# Patient Record
Sex: Female | Born: 1935 | Race: White | Hispanic: No | Smoking: Never smoker
Health system: Southern US, Community
[De-identification: ages and names within clinical notes are randomized; demographics above are authoritative.]

## PROBLEM LIST (undated history)

## (undated) DIAGNOSIS — I10 Essential (primary) hypertension: Secondary | ICD-10-CM

## (undated) DIAGNOSIS — F0390 Unspecified dementia without behavioral disturbance: Secondary | ICD-10-CM

## (undated) DIAGNOSIS — H269 Unspecified cataract: Secondary | ICD-10-CM

## (undated) DIAGNOSIS — F329 Major depressive disorder, single episode, unspecified: Secondary | ICD-10-CM

## (undated) DIAGNOSIS — F419 Anxiety disorder, unspecified: Secondary | ICD-10-CM

## (undated) HISTORY — PX: ABDOMINAL HYSTERECTOMY: SHX81

---

## 2012-01-23 ENCOUNTER — Ambulatory Visit: Payer: Self-pay | Admitting: Medical

## 2012-01-25 ENCOUNTER — Ambulatory Visit: Payer: Self-pay | Admitting: Emergency Medicine

## 2012-05-13 ENCOUNTER — Ambulatory Visit: Payer: Self-pay | Admitting: Emergency Medicine

## 2012-05-13 LAB — COMPREHENSIVE METABOLIC PANEL
Albumin: 3.6 g/dL (ref 3.4–5.0)
Alkaline Phosphatase: 61 U/L (ref 50–136)
Anion Gap: 10 (ref 7–16)
BUN: 19 mg/dL — ABNORMAL HIGH (ref 7–18)
Bilirubin,Total: 0.7 mg/dL (ref 0.2–1.0)
Calcium, Total: 9.2 mg/dL (ref 8.5–10.1)
Chloride: 101 mmol/L (ref 98–107)
Co2: 31 mmol/L (ref 21–32)
Creatinine: 0.92 mg/dL (ref 0.60–1.30)
EGFR (African American): >60
EGFR (Non-African Amer.): >60
Glucose: 110 mg/dL — ABNORMAL HIGH (ref 65–99)
Osmolality: 286 (ref 275–301)
Potassium: 3 mmol/L — ABNORMAL LOW (ref 3.5–5.1)
SGOT(AST): 27 U/L (ref 15–37)
SGPT (ALT): 26 U/L (ref 12–78)
Sodium: 142 mmol/L (ref 136–145)
Total Protein: 7.2 g/dL (ref 6.4–8.2)

## 2012-05-13 LAB — MAGNESIUM: Magnesium: 2 mg/dL

## 2012-05-13 LAB — CBC WITH DIFFERENTIAL/PLATELET
Basophil #: 0.1 10*3/uL (ref 0.0–0.1)
Basophil %: 0.8 %
Eosinophil #: 0.1 10*3/uL (ref 0.0–0.7)
Eosinophil %: 0.8 %
HCT: 44.2 % (ref 35.0–47.0)
HGB: 14.9 g/dL (ref 12.0–16.0)
Lymphocyte #: 2.2 10*3/uL (ref 1.0–3.6)
Lymphocyte %: 25.5 %
MCH: 30.8 pg (ref 26.0–34.0)
MCHC: 33.7 g/dL (ref 32.0–36.0)
MCV: 92 fL (ref 80–100)
Monocyte #: 0.6 x10 3/mm (ref 0.2–0.9)
Monocyte %: 6.8 %
Neutrophil #: 5.7 10*3/uL (ref 1.4–6.5)
Neutrophil %: 66.1 %
Platelet: 152 10*3/uL (ref 150–440)
RBC: 4.83 10*6/uL (ref 3.80–5.20)
RDW: 13.3 % (ref 11.5–14.5)
WBC: 8.7 10*3/uL (ref 3.6–11.0)

## 2012-05-13 LAB — URINALYSIS, COMPLETE
Bacteria: NEGATIVE
Bilirubin,UR: NEGATIVE
Glucose,UR: NEGATIVE mg/dL (ref 0–75)
Ketone: NEGATIVE
Leukocyte Esterase: NEGATIVE
Nitrite: NEGATIVE
Ph: 7 (ref 4.5–8.0)
Protein: NEGATIVE
Specific Gravity: 1.015 (ref 1.003–1.030)

## 2014-06-24 ENCOUNTER — Ambulatory Visit: Payer: Self-pay | Admitting: Ophthalmology

## 2014-11-24 ENCOUNTER — Emergency Department
Admission: EM | Admit: 2014-11-24 | Discharge: 2014-11-24 | Disposition: A | Discharge status: Home / Self Care | Payer: Medicare Other | Attending: Emergency Medicine | Admitting: Emergency Medicine

## 2014-11-24 ENCOUNTER — Emergency Department: Payer: Medicare Other

## 2014-11-24 ENCOUNTER — Encounter: Payer: Self-pay | Admitting: *Deleted

## 2014-11-24 ENCOUNTER — Other Ambulatory Visit: Payer: Self-pay

## 2014-11-24 DIAGNOSIS — I10 Essential (primary) hypertension: Secondary | ICD-10-CM | POA: Insufficient documentation

## 2014-11-24 DIAGNOSIS — M6281 Muscle weakness (generalized): Secondary | ICD-10-CM | POA: Diagnosis present

## 2014-11-24 DIAGNOSIS — N390 Urinary tract infection, site not specified: Secondary | ICD-10-CM

## 2014-11-24 HISTORY — DX: Essential (primary) hypertension: I10

## 2014-11-24 HISTORY — DX: Anxiety disorder, unspecified: F41.9

## 2014-11-24 HISTORY — DX: Major depressive disorder, single episode, unspecified: F32.9

## 2014-11-24 HISTORY — DX: Unspecified cataract: H26.9

## 2014-11-24 LAB — COMPREHENSIVE METABOLIC PANEL
ALT: 17 U/L (ref 14–54)
ANION GAP: 10 (ref 5–15)
AST: 33 U/L (ref 15–41)
Albumin: 3.8 g/dL (ref 3.5–5.0)
Alkaline Phosphatase: 46 U/L (ref 38–126)
BUN: 22 mg/dL — AB (ref 6–20)
CHLORIDE: 99 mmol/L — AB (ref 101–111)
CO2: 28 mmol/L (ref 22–32)
Calcium: 9.3 mg/dL (ref 8.9–10.3)
Creatinine, Ser: 0.77 mg/dL (ref 0.44–1.00)
Glucose, Bld: 110 mg/dL — ABNORMAL HIGH (ref 65–99)
POTASSIUM: 3.2 mmol/L — AB (ref 3.5–5.1)
Sodium: 137 mmol/L (ref 135–145)
TOTAL PROTEIN: 7.3 g/dL (ref 6.5–8.1)
Total Bilirubin: 0.5 mg/dL (ref 0.3–1.2)

## 2014-11-24 LAB — CBC WITH DIFFERENTIAL/PLATELET
BASOS ABS: 0.1 10*3/uL (ref 0–0.1)
Basophils Relative: 1 %
EOS PCT: 1 %
Eosinophils Absolute: 0.1 10*3/uL (ref 0–0.7)
HCT: 42.9 % (ref 35.0–47.0)
Hemoglobin: 14.6 g/dL (ref 12.0–16.0)
LYMPHS PCT: 25 %
Lymphs Abs: 2.2 10*3/uL (ref 1.0–3.6)
MCH: 31 pg (ref 26.0–34.0)
MCHC: 33.9 g/dL (ref 32.0–36.0)
MCV: 91.4 fL (ref 80.0–100.0)
MONO ABS: 0.9 10*3/uL (ref 0.2–0.9)
MONOS PCT: 11 %
Neutro Abs: 5.4 10*3/uL (ref 1.4–6.5)
Neutrophils Relative %: 62 %
PLATELETS: 139 10*3/uL — AB (ref 150–440)
RBC: 4.7 MIL/uL (ref 3.80–5.20)
RDW: 13.1 % (ref 11.5–14.5)
WBC: 8.7 10*3/uL (ref 3.6–11.0)

## 2014-11-24 LAB — URINALYSIS COMPLETE WITH MICROSCOPIC (ARMC ONLY)
BILIRUBIN URINE: NEGATIVE
GLUCOSE, UA: NEGATIVE mg/dL
KETONES UR: NEGATIVE mg/dL
NITRITE: NEGATIVE
PH: 5 (ref 5.0–8.0)
Protein, ur: NEGATIVE mg/dL
SPECIFIC GRAVITY, URINE: 1.013 (ref 1.005–1.030)

## 2014-11-24 LAB — TROPONIN I

## 2014-11-24 MED ORDER — CEPHALEXIN 500 MG PO CAPS: 500.0000 mg | ORAL_CAPSULE | Freq: Two times a day (BID) | ORAL | Status: DC

## 2014-11-24 MED ORDER — CEPHALEXIN 500 MG PO CAPS
500.0000 mg | ORAL_CAPSULE | Freq: Once | ORAL | Status: AC
  Administered 2014-11-24: 500 mg via ORAL
  Filled 2014-11-24: qty 1

## 2014-11-24 NOTE — ED Notes (Signed)
Pt arrived to ED via EMS reporting right sided weakness beginning at 1724 this evening. Pt reports not "feelign right"  At the moment. EMS reports a negative stroke screen. No deficits noted upon arrival. Pt alert and oriented. Pt denies hx stroke or TIA.

## 2014-11-24 NOTE — ED Notes (Signed)
Pt able to hold cup in right hand without signs of weakness.

## 2014-11-24 NOTE — ED Notes (Signed)
Pts nephew arrived and reported that pt stated rher right side felt weak and nephew reports momentary confusion. Pt verbalized at this time that she feels that there is a lot that went on today that she does not remember. When asked to elaborate by RN pt was not able to .

## 2014-11-24 NOTE — Discharge Instructions (Signed)
You have been seen in the Emergency Department (ED) today for generalized weakness that we believe is caused by a mild urinary tract infection (UTI).  The rest of your workup was reassuring.  Please take your antibiotic as prescribed and over-the-counter pain medication (Tylenol or Motrin) as needed, but no more than recommended on the label instructions.  Drink PLENTY of fluids.  Call your regular doctor to schedule the next available appointment to follow up on todays ED visit, or return immediately to the ED if your pain worsens, you have decreased urine production, develop fever, persistent vomiting, or other symptoms that concern you.   Urinary Tract Infection A urinary tract infection (UTI) can occur any place along the urinary tract. The tract includes the kidneys, ureters, bladder, and urethra. A type of germ called bacteria often causes a UTI. UTIs are often helped with antibiotic medicine.  HOME CARE   If given, take antibiotics as told by your doctor. Finish them even if you start to feel better.  Drink enough fluids to keep your pee (urine) clear or pale yellow.  Avoid tea, drinks with caffeine, and bubbly (carbonated) drinks.  Pee often. Avoid holding your pee in for a long time.  Pee before and after having sex (intercourse).  Wipe from front to back after you poop (bowel movement) if you are a woman. Use each tissue only once. GET HELP RIGHT AWAY IF:   You have back pain.  You have lower belly (abdominal) pain.  You have chills.  You feel sick to your stomach (nauseous).  You throw up (vomit).  Your burning or discomfort with peeing does not go away.  You have a fever.  Your symptoms are not better in 3 days. MAKE SURE YOU:   Understand these instructions.  Will watch your condition.  Will get help right away if you are not doing well or get worse. Document Released: 09/08/2007 Document Revised: 12/15/2011 Document Reviewed: 10/21/2011 [facility] Patient  Information 2015 [employer], [place]. This information is not intended to replace advice given to you by your health care provider. Make sure you discuss any questions you have with your health care provider.

## 2014-11-24 NOTE — ED Provider Notes (Signed)
[facility] Emergency Department Provider Note  ____________________________________________  Time seen: Approximately 7:29 PM  I have reviewed the triage vital signs and the nursing notes.   HISTORY  Chief Complaint Weakness  The patient is a vague historian and unable to provide details about medical history  HPI Dominique Patel is a 80 y.o. female who presents by EMS after having some generalized weakness which her family reportedly feels was more significant on the right side.  EMS performed a stroke screen which is negative and she has no focal neurological deficits.the history/onset of the symptoms is unclear, but apparently it was acute which prompted the family's concern.  The patient is comfortable at this time, smiling and joking with me and with staff.  She does not have any specific complaints other than "not feeling right" and feeling generally weak.  Of note, the paramedics told me that the patient walked to the ambulance using her cane and had no difficulties even navigating some outside stairs.  Past Medical History  Diagnosis Date  . Cataracts, bilateral   . Hypertension   . Major depressive disorder   . Anxiety disorder     There are no active problems to display for this patient.   History reviewed. No pertinent past surgical history.  Current Outpatient Rx  Name  Route  Sig  Dispense  Refill  . cephALEXin (KEFLEX) 500 MG capsule   Oral   Take 1 capsule (500 mg total) by mouth 2 (two) times daily.   14 capsule   0     Allergies Review of patient's allergies indicates not on file.  History reviewed. No pertinent family history.  Social History Social History  Substance Use Topics  . Smoking status: Never Smoker   . Smokeless tobacco: None  . Alcohol Use: No    Review of Systems Constitutional: No fever/chills Eyes: No visual changes. ENT: No sore throat. Cardiovascular: Denies chest pain. Respiratory: Denies  shortness of breath. Gastrointestinal: Mild suprapubic pain.  No nausea, no vomiting.  No diarrhea.  No constipation. Genitourinary: Negative for dysuria. Musculoskeletal: Negative for back pain. Skin: Negative for rash. Neurological: Negative for headaches, focal weakness or numbness.  10-point ROS otherwise negative.  ____________________________________________   PHYSICAL EXAM:  ED Triage Vitals  Enc Vitals Group     BP 11/24/14 1935 151/102 mmHg     Pulse Rate 11/24/14 1935 83     Resp 11/24/14 1935 17     Temp 11/24/14 1935 98.6 F (37 C)     Temp Source 11/24/14 1935 Oral     SpO2 11/24/14 1935 96 %     Weight 11/24/14 1935 191 lb 4 oz (86.75 kg)     Height 11/24/14 1935  (1.6 m)     Head Cir --      Peak Flow --      Pain Score --      Pain Loc --      Pain Edu? --      Excl. in GC? --      Constitutional: Alert and oriented. Well appearing and in no acute distress. Eyes: Conjunctivae are normal. PERRL. EOMI. Head: Atraumatic. Nose: No congestion/rhinnorhea. Mouth/Throat: Mucous membranes are moist.  Oropharynx non-erythematous. Neck: No stridor.   Cardiovascular: Normal rate, regular rhythm. Grossly normal heart sounds.  Good peripheral circulation. Respiratory: Normal respiratory effort.  No retractions. Lungs CTAB. Gastrointestinal: Soft and nontender. No distention. No abdominal bruits. No CVA tenderness. Musculoskeletal: No lower extremity tenderness  nor edema.  No joint effusions. Neurologic:  Normal speech and language. No gross focal neurologic deficits are appreciated.  Skin:  Skin is warm, dry and intact. No rash noted. Psychiatric: Mood and affect are normal. Speech and behavior are normal.  ____________________________________________   LABS (all labs ordered are listed, but only abnormal results are displayed)  Labs Reviewed  CBC WITH DIFFERENTIAL/PLATELET - Abnormal; Notable for the following:    Platelets 139 (*)    All other  components within normal limits  COMPREHENSIVE METABOLIC PANEL - Abnormal; Notable for the following:    Potassium 3.2 (*)    Chloride 99 (*)    Glucose, Bld 110 (*)    BUN 22 (*)    All other components within normal limits  URINALYSIS COMPLETEWITH MICROSCOPIC ([facility] ONLY) - Abnormal; Notable for the following:    Color, Urine YELLOW (*)    APPearance CLEAR (*)    Hgb urine dipstick 2+ (*)    Leukocytes, UA 3+ (*)    Bacteria, UA RARE (*)    Squamous Epithelial / LPF 0-5 (*)    All other components within normal limits  URINE CULTURE  TROPONIN I   ____________________________________________  EKG  ED ECG REPORT I, Raine Blodgett, the attending physician, personally viewed and interpreted this ECG.  Date: 11/24/2014 EKG Time: 19:35 Rate: 82 Rhythm: atrial fibrillation QRS Axis: normal Intervals: normal ST/T Wave abnormalities: Non-specific ST segment / T-wave changes, but no evidence of acute ischemia. Conduction Disutrbances: none Narrative Interpretation: unremarkable  ____________________________________________  RADIOLOGY I, Cailan General, personally viewed and evaluated these images (plain radiographs) as part of my medical decision making.   Ct Head Wo Contrast  11/24/2014   CLINICAL DATA:  Right side weakness beginning at 1724 hours today.  EXAM: CT HEAD WITHOUT CONTRAST  TECHNIQUE: Contiguous axial images were obtained from the base of the skull through the vertex without intravenous contrast.  COMPARISON:  None.  FINDINGS: Chronic microvascular ischemic change is identified with patchy hypoattenuation in the subcortical and periventricular deep white matter seen. No evidence of acute infarction, hemorrhage, mass lesion, mass effect, midline shift or abnormal extra-axial fluid collections identified. There is no hydrocephalus or pneumocephalus. Imaged paranasal sinuses and mastoid air cells are clear. The calvarium is intact.  IMPRESSION: No acute abnormality.  Chronic  microvascular ischemic change.   Electronically Signed   By: Drusilla Kanner M.D.   On: 11/24/2014 20:38    ____________________________________________   PROCEDURES  Procedure(s) performed: None  Critical Care performed: No   NIH Stroke Scale  Interval: Baseline Time: 9:19 PM Person Administering Scale: Raylie Maddison  Administer stroke scale items in the order listed. Record performance in each category after each subscale exam. Do not go back and change scores. Follow directions provided for each exam technique. Scores should reflect what the patient does, not what the clinician thinks the patient can do. The clinician should record answers while administering the exam and work quickly. Except where indicated, the patient should not be coached (i.e., repeated requests to patient to make a special effort).   1a  Level of consciousness: 0=alert; keenly responsive  1b. LOC questions:  0=Performs both tasks correctly  1c. LOC commands: 0=Performs both tasks correctly  2.  Best Gaze: 0=normal  3.  Visual: 0=No visual loss  4. Facial Palsy: 0=Normal symmetric movement  5a.  Motor left arm: 0=No drift, limb holds 90 (or 45) degrees for full 10 seconds  5b.  Motor right arm: 0=No drift,  limb holds 90 (or 45) degrees for full 10 seconds  6a. motor left leg: 0=No drift, limb holds 90 (or 45) degrees for full 10 seconds  6b  Motor right leg:  0=No drift, limb holds 90 (or 45) degrees for full 10 seconds  7. Limb Ataxia: 0=Absent  8.  Sensory: 0=Normal; no sensory loss  9. Best Language:  0=No aphasia, normal  10. Dysarthria: 0=Normal  11. Extinction and Inattention: 0=No abnormality  12. Distal motor function: 0=Normal   Total:   0    ____________________________________________   INITIAL IMPRESSION / ASSESSMENT AND PLAN / ED COURSE  Pertinent labs & imaging results that were available during my care of the patient were reviewed by me and considered in my medical decision making  (see chart for details).  My understanding from the paramedics is that the family was concerned about a possible stroke given their report of right sided weakness.  However, EMS states that there stroke screen was negative, and my NIH stroke scale is 0.  She has no abnormal findings on physical exam and neurological exam.  Her only complaint is that occasionally she has mild suprapubic abdominal pain.  Given the concern expressed by the family, I will obtain a CT head noncontrast for possible stroke symptoms and perform a broad evaluation with blood work and urinalysis.  I will then reassess.  ----------------------------------------- 9:17 PM on 11/24/2014 -----------------------------------------  The patient's workup was reassuring with no significant abnormalities found except for a mild to moderate urinary tract infection.  Her nephew is now present and feels that she is at her baseline and is not concerned about anythingin her current presentation.  I discussed with both of them the presence of the urinary tract infection and gave my usual and customary return precautions.  They both understand and agree and she is eager to go home. ____________________________________________  FINAL CLINICAL IMPRESSION(S) / ED DIAGNOSES  Final diagnoses:  UTI (lower urinary tract infection)      NEW MEDICATIONS STARTED DURING THIS VISIT:  New Prescriptions   CEPHALEXIN (KEFLEX) 500 MG CAPSULE    Take 1 capsule (500 mg total) by mouth 2 (two) times daily.     Loleta Rose, MD 11/24/14 2119

## 2014-11-24 NOTE — ED Notes (Signed)
Pt to CT at this time.

## 2014-11-26 LAB — URINE CULTURE: Special Requests: NORMAL

## 2015-04-18 ENCOUNTER — Other Ambulatory Visit: Payer: Self-pay | Admitting: Neurology

## 2015-04-18 DIAGNOSIS — R413 Other amnesia: Secondary | ICD-10-CM

## 2015-05-13 ENCOUNTER — Ambulatory Visit
Admission: RE | Admit: 2015-05-13 | Discharge: 2015-05-13 | Disposition: A | Discharge status: Home / Self Care | Payer: Medicare Other | Source: Ambulatory Visit | Attending: Neurology | Admitting: Neurology

## 2015-05-13 DIAGNOSIS — G3189 Other specified degenerative diseases of nervous system: Secondary | ICD-10-CM | POA: Insufficient documentation

## 2015-05-13 DIAGNOSIS — R413 Other amnesia: Secondary | ICD-10-CM

## 2015-05-13 DIAGNOSIS — R531 Weakness: Secondary | ICD-10-CM | POA: Insufficient documentation

## 2015-05-13 DIAGNOSIS — R51 Headache: Secondary | ICD-10-CM | POA: Diagnosis not present

## 2015-05-13 DIAGNOSIS — I739 Peripheral vascular disease, unspecified: Secondary | ICD-10-CM | POA: Insufficient documentation

## 2015-08-19 ENCOUNTER — Emergency Department
Admission: EM | Admit: 2015-08-19 | Discharge: 2015-08-20 | Disposition: A | Discharge status: Home / Self Care | Payer: Medicare Other | Attending: Student | Admitting: Student

## 2015-08-19 DIAGNOSIS — I10 Essential (primary) hypertension: Secondary | ICD-10-CM | POA: Diagnosis not present

## 2015-08-19 DIAGNOSIS — F329 Major depressive disorder, single episode, unspecified: Secondary | ICD-10-CM | POA: Diagnosis not present

## 2015-08-19 DIAGNOSIS — F0391 Unspecified dementia with behavioral disturbance: Secondary | ICD-10-CM | POA: Diagnosis not present

## 2015-08-19 DIAGNOSIS — R443 Hallucinations, unspecified: Secondary | ICD-10-CM | POA: Diagnosis present

## 2015-08-19 DIAGNOSIS — F0392 Unspecified dementia, unspecified severity, with psychotic disturbance: Secondary | ICD-10-CM

## 2015-08-19 DIAGNOSIS — F03918 Unspecified dementia, unspecified severity, with other behavioral disturbance: Secondary | ICD-10-CM

## 2015-08-19 DIAGNOSIS — Z79899 Other long term (current) drug therapy: Secondary | ICD-10-CM | POA: Diagnosis not present

## 2015-08-19 HISTORY — DX: Unspecified dementia, unspecified severity, without behavioral disturbance, psychotic disturbance, mood disturbance, and anxiety: F03.90

## 2015-08-19 LAB — URINALYSIS COMPLETE WITH MICROSCOPIC (ARMC ONLY)
BACTERIA UA: NONE SEEN
Bilirubin Urine: NEGATIVE
GLUCOSE, UA: NEGATIVE mg/dL
Ketones, ur: NEGATIVE mg/dL
LEUKOCYTES UA: NEGATIVE
Nitrite: NEGATIVE
PH: 6 (ref 5.0–8.0)
Protein, ur: NEGATIVE mg/dL
Specific Gravity, Urine: 1.019 (ref 1.005–1.030)

## 2015-08-19 LAB — COMPREHENSIVE METABOLIC PANEL
ALBUMIN: 3.7 g/dL (ref 3.5–5.0)
ALT: 19 U/L (ref 14–54)
AST: 29 U/L (ref 15–41)
Alkaline Phosphatase: 56 U/L (ref 38–126)
Anion gap: 8 (ref 5–15)
BUN: 26 mg/dL — AB (ref 6–20)
CHLORIDE: 98 mmol/L — AB (ref 101–111)
CO2: 30 mmol/L (ref 22–32)
Calcium: 8.9 mg/dL (ref 8.9–10.3)
Creatinine, Ser: 0.62 mg/dL (ref 0.44–1.00)
GFR calc Af Amer: >60 mL/min (ref >60)
GFR calc non Af Amer: >60 mL/min (ref >60)
GLUCOSE: 108 mg/dL — AB (ref 65–99)
POTASSIUM: 3.6 mmol/L (ref 3.5–5.1)
SODIUM: 136 mmol/L (ref 135–145)
Total Bilirubin: 0.3 mg/dL (ref 0.3–1.2)
Total Protein: 7.2 g/dL (ref 6.5–8.1)

## 2015-08-19 LAB — ACETAMINOPHEN LEVEL

## 2015-08-19 LAB — CBC
HEMATOCRIT: 42 % (ref 35.0–47.0)
HEMOGLOBIN: 14 g/dL (ref 12.0–16.0)
MCH: 30.9 pg (ref 26.0–34.0)
MCHC: 33.3 g/dL (ref 32.0–36.0)
MCV: 92.6 fL (ref 80.0–100.0)
Platelets: 145 10*3/uL — ABNORMAL LOW (ref 150–440)
RBC: 4.53 MIL/uL (ref 3.80–5.20)
RDW: 13 % (ref 11.5–14.5)
WBC: 7.1 10*3/uL (ref 3.6–11.0)

## 2015-08-19 LAB — ETHANOL

## 2015-08-19 LAB — SALICYLATE LEVEL: Salicylate Lvl: <4 mg/dL (ref 2.8–30.0)

## 2015-08-19 NOTE — ED Provider Notes (Addendum)
[facility] Emergency Department Provider Note   ____________________________________________  Time seen: Approximately 8:29 PM  I have reviewed the triage vital signs and the nursing notes.   HISTORY  Chief Complaint Hallucinations    HPI Dominique Patel is a 81 y.o. female with history of hypertension, dementia, major depressive disorder who presents for evaluation of worsening dementia and worsening chronic hallucinations, gradual onset, constant for greater than 9 months, no modifying factors. Patient reports that she hears a man singing to her. No visual hallucinations. No suicidal or homicidal ideation. Her nephew states that she currently lives independently and she is becoming difficult to care for/having a hard time living independently at this time. Family is concerned for her safety. The patient denies any chest pain, difficulty breathing, recent illness including no coughing, vomiting, diarrhea, fevers or chills.   Past Medical History  Diagnosis Date  . Cataracts, bilateral   . Hypertension   . Major depressive disorder (HCC)   . Anxiety disorder   . Dementia     There are no active problems to display for this patient.   Past Surgical History  Procedure Laterality Date  . Abdominal hysterectomy      Current Outpatient Rx  Name  Route  Sig  Dispense  Refill  . atenolol-chlorthalidone (TENORETIC) 50-25 MG tablet   Oral   Take 1 tablet by mouth daily.         Marland Kitchen donepezil (ARICEPT) 5 MG tablet   Oral   Take 1 tablet by mouth daily.         . sertraline (ZOLOFT) 50 MG tablet   Oral   Take 1 tablet by mouth daily.           Allergies Review of patient's allergies indicates no known allergies.  No family history on file.  Social History Social History  Substance Use Topics  . Smoking status: Never Smoker   . Smokeless tobacco: None  . Alcohol Use: No    Review of Systems Constitutional: No fever/chills Eyes: No  visual changes. ENT: No sore throat. Cardiovascular: Denies chest pain. Respiratory: Denies shortness of breath. Gastrointestinal: No abdominal pain.  No nausea, no vomiting.  No diarrhea.  No constipation. Genitourinary: Negative for dysuria. Musculoskeletal: Negative for back pain. Skin: Negative for rash. Neurological: Negative for headaches, focal weakness or numbness.  10-point ROS otherwise negative.  ____________________________________________   PHYSICAL EXAM:  VITAL SIGNS: ED Triage Vitals  Enc Vitals Group     BP 08/19/15 1800 137/58 mmHg     Pulse Rate 08/19/15 1800 71     Resp 08/19/15 1800 18     Temp 08/19/15 1800 98 F (36.7 C)     Temp Source 08/19/15 1800 Oral     SpO2 08/19/15 1800 98 %     Weight 08/19/15 1800 180 lb (81.647 kg)     Height 08/19/15 1800  (1.575 m)     Head Cir --      Peak Flow --      Pain Score --      Pain Loc --      Pain Edu? --      Excl. in GC? --     Constitutional: Alert and oriented x 3. Well appearing and in no acute distress. Eyes: Conjunctivae are normal. PERRL. EOMI. Head: Atraumatic. Nose: No congestion/rhinnorhea. Mouth/Throat: Mucous membranes are moist.  Oropharynx non-erythematous. Neck: No stridor.  Supple without meningismus. Cardiovascular: Normal rate, regular rhythm. Grossly normal  heart sounds.  Good peripheral circulation. Respiratory: Normal respiratory effort.  No retractions. Lungs CTAB. Gastrointestinal: Soft and nontender. No distention. No CVA tenderness. Genitourinary: deferred Musculoskeletal: No lower extremity tenderness nor edema.  No joint effusions. Neurologic:  Normal speech and language. No gross focal neurologic deficits are appreciated. Ambulates well with her cane. Skin:  Skin is warm, dry and intact. No rash noted. Psychiatric: Mood and affect are normal. Speech and behavior are normal.  ____________________________________________   LABS (all labs ordered are listed, but only  abnormal results are displayed)  Labs Reviewed  COMPREHENSIVE METABOLIC PANEL - Abnormal; Notable for the following:    Chloride 98 (*)    Glucose, Bld 108 (*)    BUN 26 (*)    All other components within normal limits  CBC - Abnormal; Notable for the following:    Platelets 145 (*)    All other components within normal limits  URINALYSIS COMPLETEWITH MICROSCOPIC ([facility] ONLY) - Abnormal; Notable for the following:    Color, Urine YELLOW (*)    APPearance CLEAR (*)    Hgb urine dipstick 1+ (*)    Squamous Epithelial / LPF 0-5 (*)    All other components within normal limits  ACETAMINOPHEN LEVEL - Abnormal; Notable for the following:    Acetaminophen (Tylenol), Serum <10 (*)    All other components within normal limits  ETHANOL  SALICYLATE LEVEL  URINE DRUG SCREEN, QUALITATIVE ([facility] ONLY)   ____________________________________________  EKG  none ____________________________________________  RADIOLOGY  none ____________________________________________   PROCEDURES  Procedure(s) performed: None  Critical Care performed: No  ____________________________________________   INITIAL IMPRESSION / ASSESSMENT AND PLAN / ED COURSE  Pertinent labs & imaging results that were available during my care of the patient were reviewed by me and considered in my medical decision making (see chart for details).  Dominique Patel is a 81 y.o. female with history of hypertension, dementia, major depressive disorder who presents for evaluation of worsening dementia and worsening chronic hallucinations. On exam, she is generally well-appearing and in no acute distress. Vital signs stable, she is afebrile. She has a benign physical exam and no acute medical complaints. I reviewed her labs. CBC unremarkable as is CMP. Urinalysis is not consistent with urinary tract infection. Undetectable ethanol, acetaminophen and salicylate levels. Her hallucinations are chronic and I suspect that they're  related to her dementia, she underwent an MRI of her brain just 2 months ago which showed only chronic disease, Nothing acute. Will consult behavioral health, TTS as well as social work for geropsychiatric evaluation. Care transferred to Dr. Zenda AlpersWebster at 11:15 PM. ____________________________________________   FINAL CLINICAL IMPRESSION(S) / ED DIAGNOSES  Final diagnoses:  Dementia with behavioral disturbance      NEW MEDICATIONS STARTED DURING THIS VISIT:  New Prescriptions   No medications on file     Note:  This document was prepared using Dragon voice recognition software and may include unintentional dictation errors.    Gayla DossEryka A Mandie Crabbe, MD 08/19/15 [id number]  Gayla DossEryka A Braxley Balandran, MD 08/19/15 [phone]

## 2015-08-19 NOTE — ED Notes (Signed)
Left and right hearing aids put in denture case and pt. Label put on and given to caretakers to take home.  Will return if needed.

## 2015-08-19 NOTE — ED Notes (Signed)
THIS EDT ASSISTED PT WITH CHANGING OUT INTO SCRUBS. THE NURSE (MATT, RN) TOOK PT'S HEARING AIDS OUT AND SENT THEM HOME WITH HER SON.

## 2015-08-19 NOTE — ED Notes (Signed)
Pt is here with her nephew, pt states she is having visual and auditory hallucinations and wants help for it.. States it is a man and she is fearful.. Pt is calm and cooperative at present.. Family states this has been going on for a few months..Dominique Patel

## 2015-08-20 DIAGNOSIS — F329 Major depressive disorder, single episode, unspecified: Secondary | ICD-10-CM

## 2015-08-20 DIAGNOSIS — F0391 Unspecified dementia with behavioral disturbance: Secondary | ICD-10-CM

## 2015-08-20 DIAGNOSIS — F0392 Unspecified dementia, unspecified severity, with psychotic disturbance: Secondary | ICD-10-CM

## 2015-08-20 LAB — URINE DRUG SCREEN, QUALITATIVE (ARMC ONLY)
Amphetamines, Ur Screen: NOT DETECTED
BARBITURATES, UR SCREEN: NOT DETECTED
BENZODIAZEPINE, UR SCRN: NOT DETECTED
Cannabinoid 50 Ng, Ur ~~LOC~~: NOT DETECTED
Cocaine Metabolite,Ur ~~LOC~~: NOT DETECTED
MDMA (Ecstasy)Ur Screen: NOT DETECTED
Methadone Scn, Ur: NOT DETECTED
OPIATE, UR SCREEN: NOT DETECTED
PHENCYCLIDINE (PCP) UR S: NOT DETECTED
Tricyclic, Ur Screen: NOT DETECTED

## 2015-08-20 MED ORDER — QUETIAPINE FUMARATE 25 MG PO TABS: 25.0000 mg | ORAL_TABLET | Freq: Every day | ORAL | Status: DC

## 2015-08-20 NOTE — Consult Note (Signed)
[place] Psychiatry Consult   Reason for Consult:  Consult for this 81 year old woman who presented with concerns about new onset hallucinations Referring Physician:  Joni Fears Patient Identification: Dominique Patel MRN:  [id number] Principal Diagnosis: Dementia with psychosis Diagnosis:   Patient Active Problem List   Diagnosis Date Noted  . Major depressive disorder ([place]) [F32.9] 08/20/2015  . Dementia with psychosis [F03.91] 08/20/2015    Total Time spent with patient: 1 hour  Subjective:   Dominique Patel is a 81 y.o. female patient admitted with "I have head problems".  HPI:  Patient interviewed. Case discussed with ER doctor and TTS. I also interviewed the patient's nephew and his wife who are the closest relatives and look in on the patient regularly. Chart reviewed labs reviewed. Spoke with outpatient psychiatrist Dr. poor. Patient presents in the company of her family with concerns that she has started to have hallucinations. Evidently over the last week or so she has started to talk about having a man who lives in a house sittings church songs to her all day long. She admits that she cannot see him and that no one else can see him either. Apparently she was complaining about this to her family but to me she indicates that it's not that big of a problem. Patient admits that her mood stays unhappy affairs of the time. She sleeps okay. She says she thinks she could probably be eating better. She denies any suicidal thoughts or wish to die denies homicidal ideation. She says that she is compliant with her medicine. Nephew says that he thinks that she is really not compliant with her medicine and that she has been forgetting to take it more frequently.  Social history: [place] Her nephew is her closest relative. Checks by on her about once or twice a week. Patient has a friend of about her age who also helps her out at times with her medicine.  Doesn't appear to have home health.  Medical history: History of high blood pressure. No other really clear medical problems.  Substance abuse history: Denies any alcohol or drug use and denies any past alcohol or drug use.  Past Psychiatric History: Patient has been seeing Dr. Nicolasa Ducking for outpatient treatment of depression. She developed a depression last year which got markedly better when she started on Zoloft. Dr. Nicolasa Ducking saw her relatively recently and the current memory problems seem to be a decline even from then. No history of psychiatric hospitalization no history of suicide attempts  Risk to Self: Suicidal Ideation: No Suicidal Intent: No Is patient at risk for suicide?: No Suicidal Plan?: No Access to Means: No What has been your use of drugs/alcohol within the last 12 months?: Reports of none How many times?: 0 Other Self Harm Risks: Reports of none Triggers for Past Attempts: None known Intentional Self Injurious Behavior: None Risk to Others: Homicidal Ideation: No Thoughts of Harm to Others: No Current Homicidal Intent: No Current Homicidal Plan: No Access to Homicidal Means: No Identified Victim: Reports of none History of harm to others?: No Assessment of Violence: None Noted Violent Behavior Description: Reports of none Does patient have access to weapons?: No Criminal Charges Pending?: No Does patient have a court date: No Prior Inpatient Therapy: Prior Inpatient Therapy: No Prior Therapy Dates: Reports of none Prior Therapy Facilty/Provider(s): Reports of none Reason for Treatment: Reports of none Prior Outpatient Therapy: Prior Outpatient Therapy: Yes Prior Therapy Dates: Current Prior  Therapy Facilty/Provider(s): Dr. Gates Rigg [facility]) Reason for Treatment: Depression Does patient have an ACCT team?: No Does patient have Intensive In-House Services?  : No Does patient have [facility] services? : No Does patient have P4CC services?: No  Past Medical  History:  Past Medical History  Diagnosis Date  . Cataracts, bilateral   . Hypertension   . Major depressive disorder ([place])   . Anxiety disorder   . Dementia     Past Surgical History  Procedure Laterality Date  . Abdominal hysterectomy     Family History: No family history on file. Family Psychiatric  History: She says she has other relatives including her brother who have Alzheimer's disease Social History:  History  Alcohol Use No     History  Drug Use Not on file    Social History   Social History  . Marital Status: Married    Spouse Name: N/A  . Number of Children: N/A  . Years of Education: N/A   Social History Main Topics  . Smoking status: Never Smoker   . Smokeless tobacco: None  . Alcohol Use: No  . Drug Use: None  . Sexual Activity: Not Asked   Other Topics Concern  . None   Social History Narrative   Additional Social History:    Allergies:  No Known Allergies  Labs:  Results for orders placed or performed during the hospital encounter of 08/19/15 (from the past 48 hour(s))  Comprehensive metabolic panel     Status: Abnormal   Collection Time: 08/19/15  6:08 PM  Result Value Ref Range   Sodium 136 135 - 145 mmol/L   Potassium 3.6 3.5 - 5.1 mmol/L   Chloride 98 (L) 101 - 111 mmol/L   CO2 30 22 - 32 mmol/L   Glucose, Bld 108 (H) 65 - 99 mg/dL   BUN 26 (H) 6 - 20 mg/dL   Creatinine, Ser 0.62 0.44 - 1.00 mg/dL   Calcium 8.9 8.9 - 10.3 mg/dL   Total Protein 7.2 6.5 - 8.1 g/dL   Albumin 3.7 3.5 - 5.0 g/dL   AST 29 15 - 41 U/L   ALT 19 14 - 54 U/L   Alkaline Phosphatase 56 38 - 126 U/L   Total Bilirubin 0.3 0.3 - 1.2 mg/dL   GFR calc non Af Amer >60 >60 mL/min   GFR calc Af Amer >60 >60 mL/min    Comment: (NOTE) The eGFR has been calculated using the CKD EPI equation. This calculation has not been validated in all clinical situations. eGFR's persistently <60 mL/min signify possible Chronic Kidney Disease.    Anion gap 8 5 - 15  CBC      Status: Abnormal   Collection Time: 08/19/15  6:08 PM  Result Value Ref Range   WBC 7.1 3.6 - 11.0 K/uL   RBC 4.53 3.80 - 5.20 MIL/uL   Hemoglobin 14.0 12.0 - 16.0 g/dL   HCT 42.0 35.0 - 47.0 %   MCV 92.6 80.0 - 100.0 fL   MCH 30.9 26.0 - 34.0 pg   MCHC 33.3 32.0 - 36.0 g/dL   RDW 13.0 11.5 - 14.5 %   Platelets 145 (L) 150 - 440 K/uL  Urinalysis complete, with microscopic ([facility] only)     Status: Abnormal   Collection Time: 08/19/15  6:08 PM  Result Value Ref Range   Color, Urine YELLOW (A) YELLOW   APPearance CLEAR (A) CLEAR   Glucose, UA NEGATIVE NEGATIVE mg/dL   Bilirubin Urine NEGATIVE  NEGATIVE   Ketones, ur NEGATIVE NEGATIVE mg/dL   Specific Gravity, Urine 1.019 1.005 - 1.030   Hgb urine dipstick 1+ (A) NEGATIVE   pH 6.0 5.0 - 8.0   Protein, ur NEGATIVE NEGATIVE mg/dL   Nitrite NEGATIVE NEGATIVE   Leukocytes, UA NEGATIVE NEGATIVE   RBC / HPF 0-5 0 - 5 RBC/hpf   WBC, UA 0-5 0 - 5 WBC/hpf   Bacteria, UA NONE SEEN NONE SEEN   Squamous Epithelial / LPF 0-5 (A) NONE SEEN   Mucous PRESENT   Ethanol     Status: None   Collection Time: 08/19/15  6:08 PM  Result Value Ref Range   Alcohol, Ethyl (B) <5 <5 mg/dL    Comment:        LOWEST DETECTABLE LIMIT FOR SERUM ALCOHOL IS 5 mg/dL FOR MEDICAL PURPOSES ONLY   Acetaminophen level     Status: Abnormal   Collection Time: 08/19/15  6:08 PM  Result Value Ref Range   Acetaminophen (Tylenol), Serum <10 (L) 10 - 30 ug/mL    Comment:        THERAPEUTIC CONCENTRATIONS VARY SIGNIFICANTLY. A RANGE OF 10-30 ug/mL MAY BE AN EFFECTIVE CONCENTRATION FOR MANY PATIENTS. HOWEVER, SOME ARE BEST TREATED AT CONCENTRATIONS OUTSIDE THIS RANGE. ACETAMINOPHEN CONCENTRATIONS >150 ug/mL AT 4 HOURS AFTER INGESTION AND >50 ug/mL AT 12 HOURS AFTER INGESTION ARE OFTEN ASSOCIATED WITH TOXIC REACTIONS.   Salicylate level     Status: None   Collection Time: 08/19/15  6:08 PM  Result Value Ref Range   Salicylate Lvl <9.7 2.8 - 30.0 mg/dL  Urine  Drug Screen, Qualitative ([facility] only)     Status: None   Collection Time: 08/19/15  6:08 PM  Result Value Ref Range   Tricyclic, Ur Screen NONE DETECTED NONE DETECTED   Amphetamines, Ur Screen NONE DETECTED NONE DETECTED   MDMA (Ecstasy)Ur Screen NONE DETECTED NONE DETECTED   Cocaine Metabolite,Ur Clint NONE DETECTED NONE DETECTED   Opiate, Ur Screen NONE DETECTED NONE DETECTED   Phencyclidine (PCP) Ur S NONE DETECTED NONE DETECTED   Cannabinoid 50 Ng, Ur [place] NONE DETECTED NONE DETECTED   Barbiturates, Ur Screen NONE DETECTED NONE DETECTED   Benzodiazepine, Ur Scrn NONE DETECTED NONE DETECTED   Methadone Scn, Ur NONE DETECTED NONE DETECTED    Comment: (NOTE) 673  Tricyclics, urine               Cutoff 1000 ng/mL 200  Amphetamines, urine             Cutoff 1000 ng/mL 300  MDMA (Ecstasy), urine           Cutoff 500 ng/mL 400  Cocaine Metabolite, urine       Cutoff 300 ng/mL 500  Opiate, urine                   Cutoff 300 ng/mL 600  Phencyclidine (PCP), urine      Cutoff 25 ng/mL 700  Cannabinoid, urine              Cutoff 50 ng/mL 800  Barbiturates, urine             Cutoff 200 ng/mL 900  Benzodiazepine, urine           Cutoff 200 ng/mL 1000 Methadone, urine                Cutoff 300 ng/mL 1100 1200 The urine drug screen provides only a preliminary, unconfirmed 1300 analytical test result and  should not be used for non-medical 1400 purposes. Clinical consideration and professional judgment should 1500 be applied to any positive drug screen result due to possible 1600 interfering substances. A more specific alternate chemical method 1700 must be used in order to obtain a confirmed analytical result.  1800 Gas chromato graphy / mass spectrometry (GC/MS) is the preferred 1900 confirmatory method.     No current facility-administered medications for this encounter.   Current Outpatient Prescriptions  Medication Sig Dispense Refill  . atenolol-chlorthalidone (TENORETIC) 50-25 MG tablet  Take 1 tablet by mouth daily.    Marland Kitchen donepezil (ARICEPT) 5 MG tablet Take 1 tablet by mouth daily.    . sertraline (ZOLOFT) 50 MG tablet Take 1 tablet by mouth daily.    . QUEtiapine (SEROQUEL) 25 MG tablet Take 1 tablet (25 mg total) by mouth at bedtime. 30 tablet 0    Musculoskeletal: Strength & Muscle Tone: decreased Gait & Station: normal Patient leans: N/A  Psychiatric Specialty Exam: Review of Systems  Constitutional: Negative.   HENT: Negative.   Eyes: Negative.   Respiratory: Negative.   Cardiovascular: Negative.   Gastrointestinal: Negative.   Musculoskeletal: Negative.   Skin: Negative.   Neurological: Negative.   Psychiatric/Behavioral: Positive for depression, hallucinations and memory loss. Negative for suicidal ideas and substance abuse. The patient is nervous/anxious. The patient does not have insomnia.     Blood pressure 130/72, pulse 72, temperature 98.2 F (36.8 C), temperature source Oral, resp. rate 20, height '5\' 2"'  (1.575 m), weight 81.647 kg (180 lb), SpO2 97 %.Body mass index is 32.91 kg/(m^2).  General Appearance: Casual  Eye Contact::  Minimal  Speech:  Slow  Volume:  Decreased  Mood:  Dysphoric  Affect:  Constricted  Thought Process:  Goal Directed  Orientation:  Full (Time, Place, and Person)  Thought Content:  Hallucinations: Auditory  Suicidal Thoughts:  No  Homicidal Thoughts:  No  Memory:  Immediate;   Good Recent;   Poor Remote;   Fair  Judgement:  Fair  Insight:  Fair  Psychomotor Activity:  Decreased  Concentration:  Fair  Recall:  [employer] of Knowledge:Fair  Language: Fair  Akathisia:  No  Handed:  Right  AIMS (if indicated):     Assets:  Communication Skills Desire for Improvement Housing Resilience Social Support  ADL's:  Impaired  Cognition: Impaired,  Mild  Sleep:      Treatment Plan Summary: Medication management and Plan 81 year old woman with recent development of more memory problems and confusion and auditory  hallucinations. Differential diagnosis would include a worsening of her depression which could be related to noncompliance with her antidepressant or worsening dementia with psychotic symptoms. I discussed the situation with the patient and with her nephew and with Dr. Burt Knack poor. I advised the family that we could admit her to a psychiatric hospital but that that would require referral to a geriatric facility which may take several days and could result in hospitalization some distance away. We discussed an alternative that she would go home they would try and have more close supervision of her for the next couple days and I would start her on 25 mg of Seroquel at night. She or he has an appointment to see Dr. Nicolasa Ducking poor on Friday. Family is agreeable to this. Prescription is written. Case reviewed with emergency room doctor. Patient will follow-up with outpatient psychiatrist on Friday.  Disposition: Patient does not meet criteria for psychiatric inpatient admission. Supportive therapy provided about ongoing stressors.  Alethia Berthold, MD 08/20/2015 4:04 PM

## 2015-08-20 NOTE — BH Assessment (Signed)
Assessment Note  Dominique Patel is an 81 y.o. female who presents to the ER due to increase AV/H and symptoms of her depression. Patient states for the last 9 months she's had an increase of crying spells, being agitated & irritable with herself. She is also starting to isolate herself, "because I don't won't nobody to see me like this." "I get that way about me. It feels like I'm not getting nothing right." Example she shared, "I get up to go to the bathroom and end up going to the kitchen and getting a glass of water."  Within the same 9 months, she has started having AV/H. "I see a man and he is there all the time. He sings to me. Mostly [employer] thou art [employer]n)." She further states, the man is there throughout the day, mostly at night "when the sun starts going down." She also reports of seeing other people in and on the outside of the home. This take place throughout the day as well.  Per her psychiatrists, Dr. Maryruth Bun, the patient has history of early dementia. When she initially started having AV/H, she had a UTI. She was started on an antibiotic by her PCP. Per nursing staff, patient does not have a UTI with his ER visit.  Patient have supports in her family. Her nephew Wayna Chalet [phone] cell), who lives near her and he goes to her house and helps. She also listed two other friends (Nellie Acorn  & Ferdie Ping) who go by her home and help her out. Patient is able to walk and uses a cane. She is able to do her ADL's but family and friends help her due to her age and she lives alone.  Per the report of the nephew ([name]), she's started to have AV/H. She's getting to the point of forgetting to take her medications. She lives alone and they "are doing their best to help." He and the patient's neighbor are helping her. "They (neighbor) calls and remind her to take her medicine. The neighbors granddaughter goes by there and helps to. Back in September, we brought  her up their because she had a stroke and that's when we found out she had UTI." It was also when they found out she was in the early stages of Dementia.  Her PCP is Dr. Hale Bogus & Dr. Charletta Cousin, with [facility].  Patient denies SI/HI. She have no history of violence or aggression. During the interview, she was calm cooperative and polite. She has some insight about the AV/H. She's aware they are not real and they are not command in nature.   Past Medical History:  Past Medical History  Diagnosis Date  . Cataracts, bilateral   . Hypertension   . Major depressive disorder ([facility])   . Anxiety disorder   . Dementia     Past Surgical History  Procedure Laterality Date  . Abdominal hysterectomy      Family History: No family history on file.  Social History:  reports that she has never smoked. She does not have any smokeless tobacco history on file. She reports that she does not drink alcohol. Her drug history is not on file.  Additional Social History:  Alcohol / Drug Use Pain Medications: See PTA Prescriptions: See PTA Over the Counter: See PTA History of alcohol / drug use?: No history of alcohol / drug abuse Longest period of sobriety (when/how long): Reports of no use Negative Consequences of Use:  (Reports of none) Withdrawal  Symptoms:  (Reports of none)  CIWA: CIWA-Ar BP: (!) 125/57 mmHg Pulse Rate: (!) 58 COWS:    Allergies: No Known Allergies  Home Medications:  (Not in a hospital admission)  OB/GYN Status:  No LMP recorded. Patient has had a hysterectomy.  General Assessment Data [facility] Assessment: In system Is this a Tele or Face-to-Face Assessment?: Face-to-Face Is this an Initial Assessment or a Re-assessment for this encounter?: Initial Assessment Marital status: Widowed DillonMaiden name: Mayford KnifeWilliams Is patient pregnant?: No Pregnancy Status: No Living Arrangements: Alone (Family and friends go to the house and help her) Can pt return  to current living arrangement?: Yes Admission Status: Voluntary Is patient capable of signing voluntary admission?: Yes Referral Source: Self/Family/Friend Insurance type: Medicare  Medical Screening Exam [facility] Walk-[facility] ONLY) Medical Exam completed: Yes  Crisis Care Plan Living Arrangements: Alone (Family and friends go to the house and help her) Legal Guardian: Other: (None) Name of Psychiatrist: Dr. Bethanie DickerKupar (Private office) Name of Therapist: Reports of none  Education Status Is patient currently in school?: No Current Grade: n/a Highest grade of school patient has completed: 9th Grade Name of school: n/a Contact person: n/a  Risk to self with the past 6 months Suicidal Ideation: No Has patient been a risk to self within the past 6 months prior to admission? : No Suicidal Intent: No Has patient had any suicidal intent within the past 6 months prior to admission? : No Is patient at risk for suicide?: No Suicidal Plan?: No Has patient had any suicidal plan within the past 6 months prior to admission? : No Access to Means: No What has been your use of drugs/alcohol within the last 12 months?: Reports of none Previous Attempts/Gestures: No How many times?: 0 Other Self Harm Risks: Reports of none Triggers for Past Attempts: None known Intentional Self Injurious Behavior: None Family Suicide History: No Recent stressful life event(s): Other (Comment) (Recent onset of  AV/H) Persecutory voices/beliefs?: No Depression: Yes Depression Symptoms: Feeling angry/irritable, Feeling worthless/self pity, Loss of interest in usual pleasures, Tearfulness, Isolating Substance abuse history and/or treatment for substance abuse?: No Suicide prevention information given to non-admitted patients: Not applicable  Risk to Others within the past 6 months Homicidal Ideation: No Does patient have any lifetime risk of violence toward others beyond the six months prior to admission? : No Thoughts of  Harm to Others: No Current Homicidal Intent: No Current Homicidal Plan: No Access to Homicidal Means: No Identified Victim: Reports of none History of harm to others?: No Assessment of Violence: None Noted Violent Behavior Description: Reports of none Does patient have access to weapons?: No Criminal Charges Pending?: No Does patient have a court date: No Is patient on probation?: No  Psychosis Hallucinations: Auditory, Visual Delusions: None noted  Mental Status Report Appearance/Hygiene: In scrubs, Unremarkable, In hospital gown Eye Contact: Good Motor Activity: Unable to assess (Patient laying in bed) Speech: Logical/coherent, Unremarkable Level of Consciousness: Alert Mood: Anxious, Depressed, Pleasant, Helpless, Sad Affect: Anxious, Appropriate to circumstance, Sad, Depressed Anxiety Level: Minimal Thought Processes: Coherent, Relevant Judgement: Partial Orientation: Person, Place, Time, Situation, Appropriate for developmental age Obsessive Compulsive Thoughts/Behaviors: Minimal  Cognitive Functioning Concentration: Normal Memory: Recent Intact, Remote Intact IQ: Average Insight: Fair Impulse Control: Fair Appetite: Good ("Oh, it's excellent!!! Especially Lays Chips") Weight Loss: 10 Weight Gain: 0 Sleep: No Change Total Hours of Sleep: 11 Vegetative Symptoms: None  ADLScreening [facility] Assessment Services) Patient's cognitive ability adequate to safely complete daily activities?:  Yes Patient able to express need for assistance with ADLs?: Yes Independently performs ADLs?: Yes (appropriate for developmental age)  Prior Inpatient Therapy Prior Inpatient Therapy: No Prior Therapy Dates: Reports of none Prior Therapy Facilty/Provider(s): Reports of none Reason for Treatment: Reports of none  Prior Outpatient Therapy Prior Outpatient Therapy: Yes Prior Therapy Dates: Current Prior Therapy Facilty/Provider(s): Dr. Bethanie Dicker (Private Office) Reason for Treatment:  Depression Does patient have an ACCT team?: No Does patient have Intensive In-House Services?  : No Does patient have [facility] services? : No Does patient have P4CC services?: No  ADL Screening (condition at time of admission) Patient's cognitive ability adequate to safely complete daily activities?: Yes Is the patient deaf or have difficulty hearing?: Yes (Use hearing aid in both ears) Does the patient have difficulty seeing, even when wearing glasses/contacts?: No Does the patient have difficulty concentrating, remembering, or making decisions?: No Patient able to express need for assistance with ADLs?: Yes Does the patient have difficulty dressing or bathing?: No Independently performs ADLs?: Yes (appropriate for developmental age) Does the patient have difficulty walking or climbing stairs?: Yes (Use cane) Weakness of Legs: Both Weakness of Arms/Hands: Both  Home Assistive Devices/Equipment Home Assistive Devices/Equipment: Cane (specify quad or straight), Dentures (specify type), Other (Comment) (Use portable shower chair)  Therapy Consults (therapy consults require a physician order) PT Evaluation Needed: No OT Evalulation Needed: No SLP Evaluation Needed: No Abuse/Neglect Assessment (Assessment to be complete while patient is alone) Physical Abuse: Denies Verbal Abuse: Denies Sexual Abuse: Denies Exploitation of patient/patient's resources: Denies Self-Neglect: Denies Values / Beliefs Cultural Requests During Hospitalization: None Spiritual Requests During Hospitalization: None Consults Spiritual Care Consult Needed: No Social Work Consult Needed: No Merchant navy officer (For Healthcare) Does patient have an advance directive?: No Would patient like information on creating an advanced directive?: No - patient declined information    Additional Information 1:1 In Past 12 Months?: No CIRT Risk: No Elopement Risk: No Does patient have medical clearance?:  Yes  Child/Adolescent Assessment Running Away Risk: Denies (Patient is an adult)  Disposition:  Disposition Initial Assessment Completed for this Encounter: Yes Disposition of Patient: Other dispositions (ER MD ordered Psych Consult) Other disposition(s): Other (Comment) (ER MD ordered Psych Consult)  On Site Evaluation by:   Reviewed with Physician:    Lilyan Gilford MS, LCAS, LPC, NCC, CCSI Therapeutic Triage Specialist 08/20/2015 11:09 AM

## 2015-08-20 NOTE — ED Provider Notes (Signed)
-----------------------------------------   7:15 AM on 08/20/2015 -----------------------------------------   Blood pressure 125/57, pulse 58, temperature 98.2 F (36.8 C), temperature source Oral, resp. rate 16, height 5\' 2"  (1.575 m), weight 180 lb (81.647 kg), SpO2 98 %.  The patient had no acute events since last update.  Calm and cooperative at this time.    The patient is awaiting psych evaluation     Rebecka ApleyAllison P Webster, MD 08/20/15 [phone]

## 2015-08-20 NOTE — ED Notes (Signed)
NAD noted at this time. Pt resting in hospital bed at this time. Denies any needs at this time. Will continue to monitor q15 min safety checks to evaluate for further patient needs.

## 2015-08-20 NOTE — ED Notes (Signed)
Pt noted to be resting in bed with her eyes closed, respirations even and unlabored, awakens with mild stimuli at this time. Will continue to monitor with q15 min safety checks at this time.

## 2015-08-20 NOTE — ED Notes (Signed)
NAD noted at time of D/C. Explained to patient's nephew and his wife and reiterated to patient's nephew about starting seroquel prescription tonight and following up with Dr. Maryruth BunKapur on Friday. Pt's family states understanding of patient D/C instructions at this time. NAD noted, respirations even and unlabored, skin warm, dry, and intact. Pt taken to the lobby via patient relations with her nephew and his wife.

## 2015-08-20 NOTE — ED Notes (Signed)
Nephew with pt. In room.  Nephew reports pt. Has been having visual and auditory hallucinations for months.  Nephew states the AH/VH have become more frequent.  Pt. Recently started on Zoloft.

## 2015-08-20 NOTE — ED Provider Notes (Signed)
-----------------------------------------   5:01 PM on 08/20/2015 -----------------------------------------  Dr. Toni Amendlapacs evaluated the patient, recommends discharge home. He has provided a prescription for Seroquel. The patient will follow-up with Dr. Maryruth BunKapur on Friday. The patient and her family are comfortable with this plan. DC home.  Gayla DossEryka A Lynnie Koehler, MD 08/20/15 [phone]

## 2015-08-20 NOTE — Progress Notes (Signed)
Clinical Child psychotherapistocial Worker (CSW) received consult for gero-psych. Psych consult is pending. CSW passed patient's outpatient psychiatrist in the hall way and she stated that this is new on set hallucinations and she would recommend inpatient psych. CSW made TTS Calvin aware of above. CSW will continue to follow and assist as needed.   Jetta LoutBailey Morgan, LCSW [phone]

## 2015-08-20 NOTE — ED Notes (Signed)
MD at bedside. 

## 2015-08-20 NOTE — ED Notes (Signed)
Pt given meal tray at this time 

## 2015-08-20 NOTE — ED Notes (Signed)
Pt resting in bed at this time. Eyes noted to be open. Pt denies any needs at this time. Will continue to monitor with q15 min safety checks at this time.

## 2015-08-20 NOTE — ED Notes (Signed)
Calvin, TTS at bedside at this time.  

## 2015-08-20 NOTE — ED Notes (Signed)
Pt visualized in NAD. Resting in bed with her eyes closed at this time. Will continue to monitor q15 min for patient needs.

## 2015-11-27 ENCOUNTER — Encounter: Payer: Self-pay | Admitting: Internal Medicine

## 2015-11-27 ENCOUNTER — Emergency Department: Payer: Medicare Other

## 2015-11-27 ENCOUNTER — Inpatient Hospital Stay
Admission: EM | Admit: 2015-11-27 | Discharge: 2015-11-29 | DRG: 202 | Payer: Medicare Other | Attending: Internal Medicine | Admitting: Internal Medicine

## 2015-11-27 DIAGNOSIS — Z90710 Acquired absence of both cervix and uterus: Secondary | ICD-10-CM | POA: Diagnosis not present

## 2015-11-27 DIAGNOSIS — N3000 Acute cystitis without hematuria: Secondary | ICD-10-CM | POA: Diagnosis present

## 2015-11-27 DIAGNOSIS — E876 Hypokalemia: Secondary | ICD-10-CM | POA: Diagnosis present

## 2015-11-27 DIAGNOSIS — I482 Chronic atrial fibrillation, unspecified: Secondary | ICD-10-CM | POA: Diagnosis present

## 2015-11-27 DIAGNOSIS — I10 Essential (primary) hypertension: Secondary | ICD-10-CM | POA: Diagnosis present

## 2015-11-27 DIAGNOSIS — Z7982 Long term (current) use of aspirin: Secondary | ICD-10-CM

## 2015-11-27 DIAGNOSIS — R0682 Tachypnea, not elsewhere classified: Secondary | ICD-10-CM | POA: Diagnosis not present

## 2015-11-27 DIAGNOSIS — Z79899 Other long term (current) drug therapy: Secondary | ICD-10-CM | POA: Diagnosis not present

## 2015-11-27 DIAGNOSIS — Z820 Family history of epilepsy and other diseases of the nervous system: Secondary | ICD-10-CM | POA: Diagnosis not present

## 2015-11-27 DIAGNOSIS — F0390 Unspecified dementia without behavioral disturbance: Secondary | ICD-10-CM | POA: Diagnosis present

## 2015-11-27 DIAGNOSIS — J45909 Unspecified asthma, uncomplicated: Secondary | ICD-10-CM | POA: Diagnosis present

## 2015-11-27 DIAGNOSIS — J209 Acute bronchitis, unspecified: Principal | ICD-10-CM | POA: Diagnosis present

## 2015-11-27 DIAGNOSIS — F329 Major depressive disorder, single episode, unspecified: Secondary | ICD-10-CM | POA: Diagnosis present

## 2015-11-27 DIAGNOSIS — I4891 Unspecified atrial fibrillation: Secondary | ICD-10-CM | POA: Diagnosis present

## 2015-11-27 DIAGNOSIS — H269 Unspecified cataract: Secondary | ICD-10-CM | POA: Diagnosis present

## 2015-11-27 DIAGNOSIS — F0391 Unspecified dementia with behavioral disturbance: Secondary | ICD-10-CM | POA: Diagnosis present

## 2015-11-27 DIAGNOSIS — F0392 Unspecified dementia, unspecified severity, with psychotic disturbance: Secondary | ICD-10-CM | POA: Diagnosis present

## 2015-11-27 DIAGNOSIS — D72829 Elevated white blood cell count, unspecified: Secondary | ICD-10-CM

## 2015-11-27 DIAGNOSIS — J189 Pneumonia, unspecified organism: Secondary | ICD-10-CM | POA: Diagnosis present

## 2015-11-27 LAB — URINALYSIS COMPLETE WITH MICROSCOPIC (ARMC ONLY)
BILIRUBIN URINE: NEGATIVE
GLUCOSE, UA: NEGATIVE mg/dL
KETONES UR: NEGATIVE mg/dL
NITRITE: POSITIVE — AB
Protein, ur: NEGATIVE mg/dL
SPECIFIC GRAVITY, URINE: 1.026 (ref 1.005–1.030)
pH: 5 (ref 5.0–8.0)

## 2015-11-27 LAB — CBC WITH DIFFERENTIAL/PLATELET
BASOS ABS: 0.1 10*3/uL (ref 0–0.1)
BASOS PCT: 1 %
EOS ABS: 0.3 10*3/uL (ref 0–0.7)
EOS PCT: 3 %
HCT: 44.3 % (ref 35.0–47.0)
HEMOGLOBIN: 15.2 g/dL (ref 12.0–16.0)
Lymphocytes Relative: 24 %
Lymphs Abs: 2.8 10*3/uL (ref 1.0–3.6)
MCH: 31.5 pg (ref 26.0–34.0)
MCHC: 34.4 g/dL (ref 32.0–36.0)
MCV: 91.7 fL (ref 80.0–100.0)
Monocytes Absolute: 1.5 10*3/uL — ABNORMAL HIGH (ref 0.2–0.9)
Monocytes Relative: 12 %
NEUTROS PCT: 60 %
Neutro Abs: 7 10*3/uL — ABNORMAL HIGH (ref 1.4–6.5)
PLATELETS: 170 10*3/uL (ref 150–440)
RBC: 4.82 MIL/uL (ref 3.80–5.20)
RDW: 13.1 % (ref 11.5–14.5)
WBC: 11.7 10*3/uL — AB (ref 3.6–11.0)

## 2015-11-27 LAB — COMPREHENSIVE METABOLIC PANEL
ALBUMIN: 3.4 g/dL — AB (ref 3.5–5.0)
ALK PHOS: 68 U/L (ref 38–126)
ALT: 20 U/L (ref 14–54)
ANION GAP: 8 (ref 5–15)
AST: 35 U/L (ref 15–41)
BUN: 25 mg/dL — ABNORMAL HIGH (ref 6–20)
CHLORIDE: 98 mmol/L — AB (ref 101–111)
CO2: 32 mmol/L (ref 22–32)
Calcium: 9.3 mg/dL (ref 8.9–10.3)
Creatinine, Ser: 0.77 mg/dL (ref 0.44–1.00)
GFR calc Af Amer: >60 mL/min (ref >60)
GFR calc non Af Amer: >60 mL/min (ref >60)
GLUCOSE: 156 mg/dL — AB (ref 65–99)
Potassium: 3.2 mmol/L — ABNORMAL LOW (ref 3.5–5.1)
SODIUM: 138 mmol/L (ref 135–145)
TOTAL PROTEIN: 7.2 g/dL (ref 6.5–8.1)
Total Bilirubin: 1.2 mg/dL (ref 0.3–1.2)

## 2015-11-27 LAB — PROTIME-INR
INR: 1.11
Prothrombin Time: 14.3 seconds (ref 11.4–15.2)

## 2015-11-27 LAB — TROPONIN I

## 2015-11-27 LAB — APTT: APTT: 28 s (ref 24–36)

## 2015-11-27 LAB — LACTIC ACID, PLASMA: LACTIC ACID, VENOUS: 2 mmol/L — AB (ref 0.5–1.9)

## 2015-11-27 MED ORDER — SODIUM CHLORIDE 0.9% FLUSH
3.0000 mL | Freq: Two times a day (BID) | INTRAVENOUS | Status: DC
  Administered 2015-11-27 – 2015-11-29 (×4): 3 mL via INTRAVENOUS

## 2015-11-27 MED ORDER — ALBUTEROL SULFATE (2.5 MG/3ML) 0.083% IN NEBU
2.5000 mg | INHALATION_SOLUTION | Freq: Once | RESPIRATORY_TRACT | Status: AC
  Administered 2015-11-27: 2.5 mg via RESPIRATORY_TRACT
  Filled 2015-11-27: qty 3

## 2015-11-27 MED ORDER — ALBUTEROL SULFATE (2.5 MG/3ML) 0.083% IN NEBU: 2.5000 mg | INHALATION_SOLUTION | RESPIRATORY_TRACT | Status: DC | PRN

## 2015-11-27 MED ORDER — ONDANSETRON HCL 4 MG PO TABS: 4.0000 mg | ORAL_TABLET | Freq: Four times a day (QID) | ORAL | Status: DC | PRN

## 2015-11-27 MED ORDER — VANCOMYCIN HCL IN DEXTROSE 1-5 GM/200ML-% IV SOLN
1000.0000 mg | INTRAVENOUS | Status: DC
  Administered 2015-11-28: 1000 mg via INTRAVENOUS
  Filled 2015-11-27: qty 200

## 2015-11-27 MED ORDER — ACETAMINOPHEN 325 MG PO TABS: 650.0000 mg | ORAL_TABLET | Freq: Four times a day (QID) | ORAL | Status: DC | PRN

## 2015-11-27 MED ORDER — VANCOMYCIN HCL IN DEXTROSE 1-5 GM/200ML-% IV SOLN
1000.0000 mg | INTRAVENOUS | Status: DC
  Filled 2015-11-27: qty 200

## 2015-11-27 MED ORDER — SERTRALINE HCL 50 MG PO TABS
50.0000 mg | ORAL_TABLET | Freq: Every day | ORAL | Status: DC
  Administered 2015-11-28 – 2015-11-29 (×2): 50 mg via ORAL
  Filled 2015-11-27 (×2): qty 1

## 2015-11-27 MED ORDER — DEXTROSE 5 % IV SOLN
2.0000 g | Freq: Two times a day (BID) | INTRAVENOUS | Status: DC
  Administered 2015-11-28 – 2015-11-29 (×3): 2 g via INTRAVENOUS
  Filled 2015-11-27 (×4): qty 2

## 2015-11-27 MED ORDER — ACETAMINOPHEN 650 MG RE SUPP: 650.0000 mg | Freq: Four times a day (QID) | RECTAL | Status: DC | PRN

## 2015-11-27 MED ORDER — HEPARIN (PORCINE) IN NACL 100-0.45 UNIT/ML-% IJ SOLN
1100.0000 [IU]/h | INTRAMUSCULAR | Status: DC
  Administered 2015-11-28: 1100 [IU]/h via INTRAVENOUS
  Filled 2015-11-27: qty 250

## 2015-11-27 MED ORDER — HEPARIN BOLUS VIA INFUSION
4000.0000 [IU] | Freq: Once | INTRAVENOUS | Status: AC
  Administered 2015-11-28: 4000 [IU] via INTRAVENOUS
  Filled 2015-11-27: qty 4000

## 2015-11-27 MED ORDER — QUETIAPINE FUMARATE 25 MG PO TABS
50.0000 mg | ORAL_TABLET | Freq: Two times a day (BID) | ORAL | Status: DC
  Administered 2015-11-28 – 2015-11-29 (×3): 50 mg via ORAL
  Filled 2015-11-27 (×3): qty 2

## 2015-11-27 MED ORDER — DONEPEZIL HCL 5 MG PO TABS
5.0000 mg | ORAL_TABLET | Freq: Every day | ORAL | Status: DC
  Administered 2015-11-28 – 2015-11-29 (×2): 5 mg via ORAL
  Filled 2015-11-27 (×2): qty 1

## 2015-11-27 MED ORDER — VANCOMYCIN HCL IN DEXTROSE 1-5 GM/200ML-% IV SOLN
1000.0000 mg | Freq: Once | INTRAVENOUS | Status: AC
  Administered 2015-11-27: 1000 mg via INTRAVENOUS
  Filled 2015-11-27: qty 200

## 2015-11-27 MED ORDER — CEFEPIME HCL 1 G IJ SOLR
1.0000 g | Freq: Once | INTRAMUSCULAR | Status: AC
  Administered 2015-11-27: 1 g via INTRAVENOUS
  Filled 2015-11-27: qty 1

## 2015-11-27 MED ORDER — ATENOLOL 25 MG PO TABS
50.0000 mg | ORAL_TABLET | Freq: Two times a day (BID) | ORAL | Status: DC
  Administered 2015-11-27 – 2015-11-28 (×2): 50 mg via ORAL
  Filled 2015-11-27 (×2): qty 2

## 2015-11-27 MED ORDER — ASPIRIN EC 81 MG PO TBEC
81.0000 mg | DELAYED_RELEASE_TABLET | Freq: Every day | ORAL | Status: DC
  Administered 2015-11-28 – 2015-11-29 (×2): 81 mg via ORAL
  Filled 2015-11-27 (×2): qty 1

## 2015-11-27 MED ORDER — ONDANSETRON HCL 4 MG/2ML IJ SOLN: 4.0000 mg | Freq: Four times a day (QID) | INTRAMUSCULAR | Status: DC | PRN

## 2015-11-27 MED ORDER — SODIUM CHLORIDE 0.9 % IV SOLN
INTRAVENOUS | Status: AC
  Administered 2015-11-28: via INTRAVENOUS

## 2015-11-27 NOTE — Progress Notes (Signed)
Pt lactic acid 2.0. MD Willis notified. MD to place orders. Will continue to monitor.   Mayra NeerNesbitt, Linzy Laury M

## 2015-11-27 NOTE — ED Provider Notes (Signed)
[facility] Emergency Department Provider Note    First MD Initiated Contact with Patient 11/27/15 1904     (approximate)  I have reviewed the triage vital signs and the nursing notes.   HISTORY  Chief Complaint Shortness of Breath; Cough; and Dementia    HPI Dominique Patel is a 81 y.o. female presenting from [facility] due to reported concern for pneumonia. Patient has a history of severe dementia.  Patient does have productive cough. Patient currently encephalopathic and unable to report why she is brought to the ER. According to EMS that the patient's roommate was recently brought to the ER today and admitted for pneumonia. She shakes her head when asked if she has any abdominal pain, nausea or vomiting.   Past Medical History:  Diagnosis Date  . Anxiety disorder   . Cataracts, bilateral   . Dementia   . Hypertension   . Major depressive disorder [facility])     Patient Active Problem List   Diagnosis Date Noted  . Major depressive disorder (HCC) 08/20/2015  . Dementia with psychosis 08/20/2015    Past Surgical History:  Procedure Laterality Date  . ABDOMINAL HYSTERECTOMY      Prior to Admission medications   Medication Sig Start Date End Date Taking? Authorizing Provider  atenolol-chlorthalidone (TENORETIC) 50-25 MG tablet Take 1 tablet by mouth daily. 06/12/15   Historical Provider, MD  donepezil (ARICEPT) 5 MG tablet Take 1 tablet by mouth daily. 07/06/15   Historical Provider, MD  QUEtiapine (SEROQUEL) 25 MG tablet Take 1 tablet (25 mg total) by mouth at bedtime. 08/20/15   Audery Amel, MD  sertraline (ZOLOFT) 50 MG tablet Take 1 tablet by mouth daily. 07/18/15   Historical Provider, MD    Allergies Review of patient's allergies indicates no known allergies.  FMH: unable to obtain 2/2 dementia  Social History Social History  Substance Use Topics  . Smoking status: Never Smoker  . Smokeless tobacco: Not on  file  . Alcohol use No    Review of Systems Unable to obtain 2/2 dementia ____________________________________________   PHYSICAL EXAM:  VITAL SIGNS: Vitals:   11/27/15 1927 11/27/15 2006  BP: 125/74 (!) 127/94  Pulse: 76 77  Resp: (!) 22 (!) 22  Temp: 98.1 F (36.7 C) 99.9 F (37.7 C)    Constitutional: Alert  disoriented (baseline) Eyes: Conjunctivae are normal. PERRL. EOMI. Head: Atraumatic. Nose: No congestion/rhinnorhea. Mouth/Throat: Mucous membranes are moist.  Oropharynx non-erythematous. Neck: No stridor. Painless ROM. No cervical spine tenderness to palpation Hematological/Lymphatic/Immunilogical: No cervical lymphadenopathy. Cardiovascular: Normal rate, regular rhythm. Grossly normal heart sounds.  Good peripheral circulation. Respiratory: Normal respiratory effort.  No retractions. Pronounced right sided inspiratory and expiratory wheezing with crackles in the base Gastrointestinal: Soft and nontender. No distention. No abdominal bruits. No CVA tenderness. Musculoskeletal: No lower extremity tenderness nor edema.  No joint effusions. Neurologic:  Normal speech and language. No gross focal neurologic deficits are appreciated. No gait instability. Skin:  Skin is warm, dry and intact. No rash noted.  ____________________________________________   LABS (all labs ordered are listed, but only abnormal results are displayed)  Results for orders placed or performed during the hospital encounter of 11/27/15 (from the past 24 hour(s))  Comprehensive metabolic panel     Status: Abnormal   Collection Time: 11/27/15  7:27 PM  Result Value Ref Range   Sodium 138 135 - 145 mmol/L   Potassium 3.2 (L) 3.5 - 5.1 mmol/L  Chloride 98 (L) 101 - 111 mmol/L   CO2 32 22 - 32 mmol/L   Glucose, Bld 156 (H) 65 - 99 mg/dL   BUN 25 (H) 6 - 20 mg/dL   Creatinine, Ser 1.61 0.44 - 1.00 mg/dL   Calcium 9.3 8.9 - 09.6 mg/dL   Total Protein 7.2 6.5 - 8.1 g/dL   Albumin 3.4 (L) 3.5 -  5.0 g/dL   AST 35 15 - 41 U/L   ALT 20 14 - 54 U/L   Alkaline Phosphatase 68 38 - 126 U/L   Total Bilirubin 1.2 0.3 - 1.2 mg/dL   GFR calc non Af Amer >60 >60 mL/min   GFR calc Af Amer >60 >60 mL/min   Anion gap 8 5 - 15  CBC WITH DIFFERENTIAL     Status: Abnormal   Collection Time: 11/27/15  7:27 PM  Result Value Ref Range   WBC 11.7 (H) 3.6 - 11.0 K/uL   RBC 4.82 3.80 - 5.20 MIL/uL   Hemoglobin 15.2 12.0 - 16.0 g/dL   HCT 04.5 40.9 - 81.1 %   MCV 91.7 80.0 - 100.0 fL   MCH 31.5 26.0 - 34.0 pg   MCHC 34.4 32.0 - 36.0 g/dL   RDW 91.4 78.2 - 95.6 %   Platelets 170 150 - 440 K/uL   Neutrophils Relative % 60 %   Neutro Abs 7.0 (H) 1.4 - 6.5 K/uL   Lymphocytes Relative 24 %   Lymphs Abs 2.8 1.0 - 3.6 K/uL   Monocytes Relative 12 %   Monocytes Absolute 1.5 (H) 0.2 - 0.9 K/uL   Eosinophils Relative 3 %   Eosinophils Absolute 0.3 0 - 0.7 K/uL   Basophils Relative 1 %   Basophils Absolute 0.1 0 - 0.1 K/uL  Troponin I     Status: None   Collection Time: 11/27/15  7:27 PM  Result Value Ref Range   Troponin I <0.03 <0.03 ng/mL   ____________________________________________  EKG My review and personal interpretation at Time: 20:07   Indication: sob  Rate: 75  Rhythm: afib Axis: normal Other: no acute ST elevations ____________________________________________  RADIOLOGY  CXR my read shows patchy infiltrate in RLL, no PTX ____________________________________________   PROCEDURES  Procedure(s) performed: none    Critical Care performed: no ____________________________________________   INITIAL IMPRESSION / ASSESSMENT AND PLAN / ED COURSE  Pertinent labs & imaging results that were available during my care of the patient were reviewed by me and considered in my medical decision making (see chart for details).  DDX: PNA, CHF< ACS< sepsis, uti  Dominique Patel is a 81 y.o. who presents to the ED with acute kidney with mild respiratory distress in the setting of a  patient with severe dementia presenting from assisted living facility. Presentation is concerning for pneumonia given isolated lung findings on the right side with leukocytosis and fever with tachypnea. Her abdominal exam is soft and benign. She has no meningismus. Her mental status appears to be at baseline. Do not feel is clinically consistent with meningitis or encephalitis. We will order laboratory evaluation to assess her above complaints. We'll order chest x-ray to evaluate for congestive heart failure or consolidation. We'll provide nebulizer for wheezing and antibiotics for suspected pneumonia.  Clinical Course  Comment By Time  Patient does have acute leukocytosis with left shift. Elevated the bun suggesting dehydration.  She has mild improvement after albuterol treatment. Antibiotics currently infusing.  Based on her moderate respiratory distress and evidence of acute  pneumonia have discussed case with Dr. Anne HahnWillis who agrees to admit patient for further evaluation and management. Willy EddyPatrick Jakie Debow, MD 08/24 2028     ____________________________________________   FINAL CLINICAL IMPRESSION(S) / ED DIAGNOSES  Final diagnoses:  HCAP (healthcare-associated pneumonia)  Tachypnea  Leukocytosis  Dementia, without behavioral disturbance      NEW MEDICATIONS STARTED DURING THIS VISIT:  New Prescriptions   No medications on file     Note:  This document was prepared using Dragon voice recognition software and may include unintentional dictation errors.    Willy EddyPatrick Tisha Cline, MD 11/27/15 2031

## 2015-11-27 NOTE — Progress Notes (Addendum)
Pharmacy Antibiotic Note  Dominique Patel is a 81 y.o. female admitted on 11/27/2015 with sepsis.  Pharmacy has been consulted for vancomycin and cefepime dosing.  Plan: DW 65kg  Vd 46L kei 0.053 hr-1  t1/2 13 hours Vancomycin 1 gram q 18 hours ordered with stacked dosing. Level before 5th dose. Goal trough 15-20.  Cefepime 2 grams q 12 hours ordered.   Height: 5\' 4"  (162.6 cm) Weight: 180 lb (81.6 kg) IBW/kg (Calculated) : 54.7  Temp (24hrs), Avg:98.9 F (37.2 C), Min:97.7 F (36.5 C), Max:99.9 F (37.7 C)   Recent Labs Lab 11/27/15 1927  WBC 11.7*  CREATININE 0.77    Estimated Creatinine Clearance: 59 mL/min (by C-G formula based on SCr of 0.8 mg/dL).    No Known Allergies  Antimicrobials this admission: vancomycin  >>  cefepime  >>   Dose adjustments this admission:   Microbiology results: 8/24 BCx: pending 8/24 Sputum Cx: pending   8/24 UA: LE(+) NO2(+) WBC TNTC 8/24 CXR: no acute disease  Thank you for allowing pharmacy to be a part of this patient's care.  Kimora Stankovic S 11/27/2015 10:19 PM

## 2015-11-27 NOTE — H&P (Signed)
[facility] [place] [facility]   PATIENT NAME: Dominique HeadsSally Patel    MR#:  [id number]  DATE OF BIRTH:  11/01/1934  DATE OF ADMISSION:  11/27/2015  PRIMARY CARE PHYSICIAN: Pcp Not In System   REQUESTING/REFERRING PHYSICIAN: Roxan Hockeyobinson, MD  CHIEF COMPLAINT:   Chief Complaint  Patient presents with  . Shortness of Breath  . Cough  . Dementia    HISTORY OF PRESENT ILLNESS:  Dominique Patel  is a 81 y.o. female who presents with Shortness of breath and progressive cough productive of sputum. Patient has no history of COPD or other lung diseases. She states she feels like she had low-grade fever today. Other symptoms have been progressing over the last couple of days. Here in the ED she was evaluated and felt to have pneumonia. She has been residing in a nursing facility.  She states she was scheduled to be discharged to home today, but then was sent to the ED for evaluation. Chest x-ray is not read as suggestive of pneumonia but clinically patient has significant symptoms and so hospitalists were called for admission.  PAST MEDICAL HISTORY:   Past Medical History:  Diagnosis Date  . Anxiety disorder   . Cataracts, bilateral   . Dementia   . Hypertension   . Major depressive disorder (HCC)     PAST SURGICAL HISTORY:   Past Surgical History:  Procedure Laterality Date  . ABDOMINAL HYSTERECTOMY      SOCIAL HISTORY:   Social History  Substance Use Topics  . Smoking status: Never Smoker  . Smokeless tobacco: Not on file  . Alcohol use No    FAMILY HISTORY:   Family History  Problem Relation Age of Onset  . Alzheimer's disease Brother     DRUG ALLERGIES:  No Known Allergies  MEDICATIONS AT HOME:   Prior to Admission medications   Medication Sig Start Date End Date Taking? Authorizing Provider  acetaminophen (TYLENOL) 325 MG tablet Take 650 mg by mouth every 6 (six) hours as needed.   Yes Historical Provider, MD  aspirin EC 81 MG tablet Take 81  mg by mouth daily.   Yes Historical Provider, MD  donepezil (ARICEPT) 5 MG tablet Take 1 tablet by mouth daily. 07/06/15  Yes Historical Provider, MD  QUEtiapine (SEROQUEL) 50 MG tablet Take 50 mg by mouth 2 (two) times daily.   Yes Historical Provider, MD  sertraline (ZOLOFT) 50 MG tablet Take 1 tablet by mouth daily. 07/18/15  Yes Historical Provider, MD  vitamin B-12 (CYANOCOBALAMIN) 1000 MCG tablet Take 1,000 mcg by mouth daily.   Yes Historical Provider, MD  atenolol-chlorthalidone (TENORETIC) 50-25 MG tablet Take 1 tablet by mouth daily. 06/12/15   Historical Provider, MD    REVIEW OF SYSTEMS:  Review of Systems  Constitutional: Positive for fever (subjective). Negative for chills, malaise/fatigue and weight loss.  HENT: Negative for ear pain, hearing loss and tinnitus.   Eyes: Negative for blurred vision, double vision, pain and redness.  Respiratory: Positive for cough, sputum production and shortness of breath. Negative for hemoptysis.   Cardiovascular: Negative for chest pain, palpitations, orthopnea and leg swelling.  Gastrointestinal: Negative for abdominal pain, constipation, diarrhea, nausea and vomiting.  Genitourinary: Negative for dysuria, frequency and hematuria.  Musculoskeletal: Negative for back pain, joint pain and neck pain.  Skin:       No acne, rash, or lesions  Neurological: Negative for dizziness, tremors, focal weakness and weakness.  Endo/Heme/Allergies: Negative for polydipsia. Does not bruise/bleed easily.  Psychiatric/Behavioral: Negative for depression. The patient is not nervous/anxious and does not have insomnia.      VITAL SIGNS:   Vitals:   11/27/15 1927 11/27/15 2006 11/27/15 2030 11/27/15 2100  BP: 125/74 (!) 127/94 130/74 127/79  Pulse: 76 77 81 84  Resp: (!) 22 (!) 22 19 18   Temp: 98.1 F (36.7 C) 99.9 F (37.7 C)  99.4 F (37.4 C)  TempSrc: Oral Oral  Oral  SpO2: 97% 95% 93% 94%  Weight: 81.6 kg (180 lb)     Height: 5\' 4"  (1.626 m)      Wt  Readings from Last 3 Encounters:  11/27/15 81.6 kg (180 lb)  08/19/15 81.6 kg (180 lb)  11/24/14 86.8 kg (191 lb 4 oz)    PHYSICAL EXAMINATION:  Physical Exam  Vitals reviewed. Constitutional: She is oriented to person, place, and time. She appears well-developed and well-nourished. No distress.  HENT:  Head: Normocephalic and atraumatic.  Mouth/Throat: Oropharynx is clear and moist.  Eyes: Conjunctivae and EOM are normal. Pupils are equal, round, and reactive to light. No scleral icterus.  Neck: Normal range of motion. Neck supple. No JVD present. No thyromegaly present.  Cardiovascular: Normal rate, regular rhythm and intact distal pulses.  Exam reveals no gallop and no friction rub.   No murmur heard. Respiratory: Effort normal. No respiratory distress. She has no wheezes. She has no rales.  Bilateral right greater than left basilar coarse breath sounds  GI: Soft. Bowel sounds are normal. She exhibits no distension. There is no tenderness.  Musculoskeletal: Normal range of motion. She exhibits no edema.  No arthritis, no gout  Lymphadenopathy:    She has no cervical adenopathy.  Neurological: She is alert and oriented to person, place, and time. No cranial nerve deficit.  No dysarthria, no aphasia  Skin: Skin is warm and dry. No rash noted. No erythema.  Psychiatric: She has a normal mood and affect. Her behavior is normal. Judgment and thought content normal.    LABORATORY PANEL:   CBC  Recent Labs Lab 11/27/15 1927  WBC 11.7*  HGB 15.2  HCT 44.3  PLT 170   ------------------------------------------------------------------------------------------------------------------  Chemistries   Recent Labs Lab 11/27/15 1927  NA 138  K 3.2*  CL 98*  CO2 32  GLUCOSE 156*  BUN 25*  CREATININE 0.77  CALCIUM 9.3  AST 35  ALT 20  ALKPHOS 68  BILITOT 1.2    ------------------------------------------------------------------------------------------------------------------  Cardiac Enzymes  Recent Labs Lab 11/27/15 1927  TROPONINI <0.03   ------------------------------------------------------------------------------------------------------------------  RADIOLOGY:  Dg Chest 2 View  Result Date: 11/27/2015 CLINICAL DATA:  Pt arrives via [facility] from [facility] with reports of shortness of breath, wheezing and a cough Pt with a history of dementia EXAM: CHEST  2 VIEW COMPARISON:  None. FINDINGS: The cardiac silhouette is normal in size and configuration. No mediastinal or hilar masses or evidence of adenopathy. Lungs are hyperexpanded but clear. No pleural effusion or pneumothorax. Skeletal structures are demineralized but grossly intact. IMPRESSION: No acute cardiopulmonary disease. Electronically Signed   By: Amie Portland M.D.   On: 11/27/2015 19:55    EKG:   Orders placed or performed during the hospital encounter of 11/27/15  . ED EKG 12-Lead  . ED EKG 12-Lead  . EKG 12-Lead  . EKG 12-Lead    IMPRESSION AND PLAN:  Principal Problem:   HCAP (healthcare-associated pneumonia) - broad spectrum antibiotics started in the ED and cultures ordered, including sputum culture. When necessary DuoNeb's Active  Problems:   New onset atrial fibrillation (HCC) - patient was found to be in A. fib, on chart review no prior history of the same. Heparin drip started, and she will likely need evaluation for further anticoagulation upon discharge. Might consider cardiology consult   Major depressive disorder (HCC) - continue home meds   Dementia with psychosis - continue home meds   HTN (hypertension) - currently stable, continue home meds  All the records are reviewed and case discussed with ED provider. Management plans discussed with the patient and/or family.  DVT PROPHYLAXIS: Systemic anticoagulation  GI PROPHYLAXIS: PPI  ADMISSION STATUS:  Inpatient  CODE STATUS: Full Code Status History    This patient does not have a recorded code status. Please follow your organizational policy for patients in this situation.      TOTAL TIME TAKING CARE OF THIS PATIENT: 45 minutes.    Shaul Trautman FIELDING 11/27/2015, 9:05 PM  [facility]  Office  [phone]  CC: Primary care physician; Pcp Not In System

## 2015-11-27 NOTE — ED Notes (Signed)
Pt seems to be more confused at this time. Pt knows she at is at the hospital. Pt not attempting to get out of the bed. Talking about [employer], someone she cares for at the AL facility. Pt states she doesn't want to got back to [facility].

## 2015-11-27 NOTE — ED Notes (Signed)
Admiting MD at bedside

## 2015-11-27 NOTE — ED Triage Notes (Signed)
[facility] with reports of shortness of breath, wheezing and a cough  Pt with a history of dementia

## 2015-11-27 NOTE — Progress Notes (Signed)
ANTICOAGULATION CONSULT NOTE - Initial Consult  Pharmacy Consult for heparin drip Indication: atrial fibrillation  No Known Allergies  Patient Measurements: Height: 5\' 4"  (162.6 cm) Weight: 180 lb (81.6 kg) IBW/kg (Calculated) : 54.7 Heparin Dosing Weight: 72kg  Vital Signs: Temp: 97.7 F (36.5 C) (08/24 2202) Temp Source: Oral (08/24 2202) BP: 140/76 (08/24 2202) Pulse Rate: 85 (08/24 2202)  Labs:  Recent Labs  11/27/15 1927 11/27/15 1937  HGB 15.2  --   HCT 44.3  --   PLT 170  --   APTT  --  28  LABPROT  --  14.3  INR  --  1.11  CREATININE 0.77  --   TROPONINI <0.03  --     Estimated Creatinine Clearance: 59 mL/min (by C-G formula based on SCr of 0.8 mg/dL).   Medical History: Past Medical History:  Diagnosis Date  . Anxiety disorder   . Cataracts, bilateral   . Dementia   . Hypertension   . Major depressive disorder (HCC)     Medications:  No anticoagulation in PTA meds.  Assessment:  Goal of Therapy:  Heparin level 0.3-0.7 units/ml Monitor platelets by anticoagulation protocol: Yes   Plan:  4000 unit bolus and initial rate of 1100 units/hr. First anti-Xa 8 hours after start of infusion.  Marcelle Bebout S 11/27/2015,11:16 PM

## 2015-11-28 LAB — CBC
HCT: 41.3 % (ref 35.0–47.0)
Hemoglobin: 14.1 g/dL (ref 12.0–16.0)
MCH: 31.2 pg (ref 26.0–34.0)
MCHC: 34.1 g/dL (ref 32.0–36.0)
MCV: 91.5 fL (ref 80.0–100.0)
PLATELETS: 141 10*3/uL — AB (ref 150–440)
RBC: 4.52 MIL/uL (ref 3.80–5.20)
RDW: 13.1 % (ref 11.5–14.5)
WBC: 11.6 10*3/uL — ABNORMAL HIGH (ref 3.6–11.0)

## 2015-11-28 LAB — HEPARIN LEVEL (UNFRACTIONATED): Heparin Unfractionated: 0.87 IU/mL — ABNORMAL HIGH (ref 0.30–0.70)

## 2015-11-28 LAB — BASIC METABOLIC PANEL
Anion gap: 8 (ref 5–15)
BUN: 22 mg/dL — ABNORMAL HIGH (ref 6–20)
CALCIUM: 8.5 mg/dL — AB (ref 8.9–10.3)
CHLORIDE: 99 mmol/L — AB (ref 101–111)
CO2: 30 mmol/L (ref 22–32)
CREATININE: 0.55 mg/dL (ref 0.44–1.00)
GFR calc Af Amer: >60 mL/min (ref >60)
GFR calc non Af Amer: >60 mL/min (ref >60)
GLUCOSE: 119 mg/dL — AB (ref 65–99)
Potassium: 2.9 mmol/L — ABNORMAL LOW (ref 3.5–5.1)
Sodium: 137 mmol/L (ref 135–145)

## 2015-11-28 LAB — LACTIC ACID, PLASMA
LACTIC ACID, VENOUS: 1.1 mmol/L (ref 0.5–1.9)
Lactic Acid, Venous: 1.6 mmol/L (ref 0.5–1.9)

## 2015-11-28 LAB — MAGNESIUM: Magnesium: 1.6 mg/dL — ABNORMAL LOW (ref 1.7–2.4)

## 2015-11-28 LAB — POTASSIUM: Potassium: 3.1 mmol/L — ABNORMAL LOW (ref 3.5–5.1)

## 2015-11-28 MED ORDER — MAGNESIUM SULFATE 2 GM/50ML IV SOLN
2.0000 g | Freq: Once | INTRAVENOUS | Status: AC
  Administered 2015-11-28: 2 g via INTRAVENOUS
  Filled 2015-11-28: qty 50

## 2015-11-28 MED ORDER — POTASSIUM CHLORIDE 10 MEQ/100ML IV SOLN
10.0000 meq | INTRAVENOUS | Status: DC
  Administered 2015-11-28: 10 meq via INTRAVENOUS
  Filled 2015-11-28 (×4): qty 100

## 2015-11-28 MED ORDER — APIXABAN 5 MG PO TABS
5.0000 mg | ORAL_TABLET | Freq: Two times a day (BID) | ORAL | Status: DC
  Administered 2015-11-28 – 2015-11-29 (×3): 5 mg via ORAL
  Filled 2015-11-28 (×3): qty 1

## 2015-11-28 MED ORDER — POTASSIUM CHLORIDE CRYS ER 20 MEQ PO TBCR
40.0000 meq | EXTENDED_RELEASE_TABLET | ORAL | Status: AC
  Administered 2015-11-28 (×2): 40 meq via ORAL
  Filled 2015-11-28 (×2): qty 2

## 2015-11-28 MED ORDER — IPRATROPIUM-ALBUTEROL 0.5-2.5 (3) MG/3ML IN SOLN: 3.0000 mL | RESPIRATORY_TRACT | Status: DC

## 2015-11-28 MED ORDER — IPRATROPIUM-ALBUTEROL 0.5-2.5 (3) MG/3ML IN SOLN
3.0000 mL | RESPIRATORY_TRACT | Status: DC | PRN
Start: 2015-11-28 — End: 2015-11-29

## 2015-11-28 MED ORDER — ATENOLOL 25 MG PO TABS
50.0000 mg | ORAL_TABLET | Freq: Every day | ORAL | Status: DC
  Administered 2015-11-29: 50 mg via ORAL
  Filled 2015-11-28: qty 2

## 2015-11-28 NOTE — Progress Notes (Signed)
ANTICOAGULATION CONSULT NOTE - Initial Consult  Pharmacy Consult for Apixaban Indication: atrial fibrillation  No Known Allergies  Patient Measurements: Height: 5\' 4"  (162.6 cm) Weight: 180 lb (81.6 kg) IBW/kg (Calculated) : 54.7 Heparin Dosing Weight:  Vital Signs: Temp: 98.5 F (36.9 C) (08/25 0450) Temp Source: Oral (08/25 0450) BP: 126/57 (08/25 0450) Pulse Rate: 73 (08/25 0450)  Labs:  Recent Labs  11/27/15 1927 11/27/15 1937 11/28/15 0449 11/28/15 0751  HGB 15.2  --  14.1  --   HCT 44.3  --  41.3  --   PLT 170  --  141*  --   APTT  --  28  --   --   LABPROT  --  14.3  --   --   INR  --  1.11  --   --   HEPARINUNFRC  --   --   --  0.87*  CREATININE 0.77  --  0.55  --   TROPONINI <0.03  --   --   --     Estimated Creatinine Clearance: 59 mL/min (by C-G formula based on SCr of 0.8 mg/dL).   Medical History: Past Medical History:  Diagnosis Date  . Anxiety disorder   . Cataracts, bilateral   . Dementia   . Hypertension   . Major depressive disorder (HCC)     Medications:  Scheduled:  . apixaban  5 mg Oral BID  . aspirin EC  81 mg Oral Daily  . atenolol  50 mg Oral BID  . ceFEPime (MAXIPIME) IV  2 g Intravenous Q12H  . donepezil  5 mg Oral Daily  . potassium chloride  40 mEq Oral Q4H  . QUEtiapine  50 mg Oral BID  . sertraline  50 mg Oral Daily  . sodium chloride flush  3 mL Intravenous Q12H    Assessment: 81 yo female with new onset Atrial fibrillation on aspirin at home.  Goal of Therapy:   Monitor platelets by anticoagulation protocol: Yes   Plan:  Begin Apixaban 5 mg po bid at time that Heparin drip is discontinued.  64(81 yo F , Wt 81.6 kg Scr 0.55).   Lucillie Kiesel A 11/28/2015,9:27 AM

## 2015-11-28 NOTE — [place] FL2 (Addendum)
[place] MEDICAID FL2 LEVEL OF CARE SCREENING TOOL     IDENTIFICATION  Patient Name: Dominique Patel Birthdate: 1934/09/30 Sex: female Admission Date (Current Location): 11/27/2015  [place]   Facility and Address:  [facility], [address], [place], [place] [address]      Provider Number: [id number]  Attending Physician Name and Address:  Enid Baasadhika Kalisetti, MD  Relative Name and Phone Number:  Ricky StabsWhite,Margaret H Friend [phone] or Damita LackWilliams,[name] Nephew [phone]     Current Level of Care: Hospital Recommended Level of Care: Assisted Living Facility, Memory Care Prior Approval Number:    Date Approved/Denied:   PASRR Number:    Discharge Plan: Other (Comment) (ALF with Memory Care)    Current Diagnoses: Patient Active Problem List   Diagnosis Date Noted  . HCAP (healthcare-associated pneumonia) 11/27/2015  . HTN (hypertension) 11/27/2015  . New onset atrial fibrillation (HCC) 11/27/2015  . Major depressive disorder (HCC) 08/20/2015  . Dementia with psychosis 08/20/2015    Orientation RESPIRATION BLADDER Height & Weight     Self, Place  Normal Continent Weight: 180 lb (81.6 kg) Height:  5\' 4"  (162.6 cm)  BEHAVIORAL SYMPTOMS/MOOD NEUROLOGICAL BOWEL NUTRITION STATUS      Continent Diet (Cardiac Diet)  AMBULATORY STATUS COMMUNICATION OF NEEDS Skin   Supervision Verbally Normal                       Personal Care Assistance Level of Assistance  Bathing, Feeding, Dressing Bathing Assistance: Limited assistance Feeding assistance: Independent Dressing Assistance: Limited assistance     Functional Limitations Info  Sight, Speech, Hearing Sight Info: Adequate Hearing Info: Adequate Speech Info: Adequate    SPECIAL CARE FACTORS FREQUENCY  PT (By licensed PT)     PT Frequency: No follow up              Contractures Contractures Info: Not present    Additional Factors Info   Allergies, Code Status, Psychotropic Code Status Info: Full Code Allergies Info: NKA Psychotropic Info: QUEtiapine (SEROQUEL) tablet 50 mg and sertraline (ZOLOFT) tablet 50 mg         Current Medications (11/28/2015):  This is the current hospital active medication list Current Facility-Administered Medications  Medication Dose Route Frequency Provider Last Rate Last Dose  . acetaminophen (TYLENOL) tablet 650 mg  650 mg Oral Q6H PRN Oralia Manisavid Willis, MD       Or  . acetaminophen (TYLENOL) suppository 650 mg  650 mg Rectal Q6H PRN Oralia Manisavid Willis, MD      . albuterol (PROVENTIL) (2.5 MG/3ML) 0.083% nebulizer solution 2.5 mg  2.5 mg Nebulization Q2H PRN Oralia Manisavid Willis, MD      . apixaban Everlene Balls(ELIQUIS) tablet 5 mg  5 mg Oral BID Enid Baasadhika Kalisetti, MD   5 mg at 11/28/15 0957  . aspirin EC tablet 81 mg  81 mg Oral Daily Oralia Manisavid Willis, MD   81 mg at 11/28/15 0957  . [START ON 11/29/2015] atenolol (TENORMIN) tablet 50 mg  50 mg Oral Daily Enid Baasadhika Kalisetti, MD      . ceFEPIme (MAXIPIME) 2 g in dextrose 5 % 50 mL IVPB  2 g Intravenous Q12H Oralia Manisavid Willis, MD   2 g at 11/28/15 [id number]  . donepezil (ARICEPT) tablet 5 mg  5 mg Oral Daily Oralia Manisavid Willis, MD   5 mg at 11/28/15 [id number]  . ipratropium-albuterol (DUONEB) 0.5-2.5 (3) MG/3ML nebulizer solution 3 mL  3 mL Nebulization Q4H PRN Enid Baasadhika Kalisetti,  MD      . magnesium sulfate IVPB 2 g 50 mL  2 g Intravenous Once Enid Baas, MD      . ondansetron [facility]) tablet 4 mg  4 mg Oral Q6H PRN Oralia Manis, MD       Or  . ondansetron [facility]) injection 4 mg  4 mg Intravenous Q6H PRN Oralia Manis, MD      . QUEtiapine (SEROQUEL) tablet 50 mg  50 mg Oral BID Oralia Manis, MD   50 mg at 11/28/15 0957  . sertraline (ZOLOFT) tablet 50 mg  50 mg Oral Daily Oralia Manis, MD   50 mg at 11/28/15 1001  . sodium chloride flush (NS) 0.9 % injection 3 mL  3 mL Intravenous Q12H Oralia Manis, MD   3 mL at 11/28/15 0957     Discharge Medications: Medication List    STOP taking  these medications   aspirin EC 81 MG tablet  atenolol-chlorthalidone 50-25 MG tablet Commonly known as:  TENORETIC Replaced by:  atenolol 50 MG tablet    TAKE these medications   acetaminophen 325 MG tablet Commonly known as:  TYLENOL Take 650 mg by mouth every 6 (six) hours as needed.  albuterol (2.5 MG/3ML) 0.083% nebulizer solution Commonly known as:  PROVENTIL Take 3 mLs (2.5 mg total) by nebulization every 6 (six) hours as needed for wheezing or shortness of breath.  apixaban 5 MG Tabs tablet Commonly known as:  ELIQUIS Take 1 tablet (5 mg total) by mouth 2 (two) times daily.  atenolol 50 MG tablet Commonly known as:  TENORMIN Take 1 tablet (50 mg total) by mouth daily. Replaces:  atenolol-chlorthalidone 50-25 MG tablet  benzonatate 100 MG capsule Commonly known as:  TESSALON PERLES Take 1 capsule (100 mg total) by mouth 3 (three) times daily. X 7 days  chlorpheniramine-HYDROcodone 10-8 MG/5ML Suer Commonly known as:  TUSSIONEX Take 5 mLs by mouth every 12 (twelve) hours. X 7 days  donepezil 5 MG tablet Commonly known as:  ARICEPT Take 1 tablet by mouth daily.  levofloxacin 500 MG tablet Commonly known as:  LEVAQUIN Take 1 tablet (500 mg total) by mouth daily. X 7 days  mometasone-formoterol 200-5 MCG/ACT Aero Commonly known as:  DULERA Inhale 2 puffs into the lungs 2 (two) times daily.  QUEtiapine 50 MG tablet Commonly known as:  SEROQUEL Take 50 mg by mouth 2 (two) times daily.  sertraline 50 MG tablet Commonly known as:  ZOLOFT Take 1 tablet by mouth daily.  vitamin B-12 1000 MCG tablet Commonly known as:  CYANOCOBALAMIN Take 1,000 mcg by mouth daily.     Relevant Imaging Results:  Relevant Lab Results:   Additional Information SSN [id number]  Darleene Cleaver

## 2015-11-28 NOTE — Clinical Social Work Note (Signed)
Clinical Social Work Assessment  Patient Details  Name: Dominique LocketSally W Everitt MRN: [id number] Date of Birth: 1934/05/29  Date of referral:  11/28/15               Reason for consult:  Facility Placement                Permission sought to share information with:  Family Supports, Magazine features editoracility Contact Representative Permission granted to share information::  Yes, Verbal Permission Granted  Name::     Ricky StabsWhite,Margaret H Friend [phone] or Damita LackWilliams,[name] Nephew [phone]   Agency::  [facility]  Relationship::     Contact Information:     Housing/Transportation Living arrangements for the past 2 months:  Assisted Living Facility Source of Information:  Patient, Medical Team Patient Interpreter Needed:  None Criminal Activity/Legal Involvement Pertinent to Current Situation/Hospitalization:  No - Comment as needed Significant Relationships:  Friend Lives with:  Facility Resident Do you feel safe going back to the place where you live?  Yes Need for family participation in patient care:  Yes (Comment) (Patient has some dementia)  Care giving concerns:  Patient does not have any concerns about returning back to ALF   Social Worker assessment / plan:  Patient is a 81 year old female who has dementia and [facility].  Patient is alert and oriented x3.  Patient states she is happy where she is staying and likes the people there with her.  Patient expressed that she would like to go home, but the ALF is good for her.  Patient had some confusion talking about the [employer] taking some of her things, but according to ALF, she hallucinates at times.  Patient expressed that she has a friend who is able to help her when she needs things.  Patient did not express any other issues or concerns.  Employment status:  Retired Health and safety inspectornsurance information:  Medicare PT Recommendations:  No Follow Up Information / Referral to community resources:     Patient/Family's Response to care:  Patient agreeable  to returning back to ALF.  Patient/Family's Understanding of and Emotional Response to Diagnosis, Current Treatment, and Prognosis:  Patient is pleasant and talkative did not feel like anything is wrong with her and she expressed she is feeling better.  Emotional Assessment Appearance:  Appears stated age Attitude/Demeanor/Rapport:    Affect (typically observed):  Appropriate, Calm, Pleasant Orientation:  Oriented to Self, Oriented to Place Alcohol / Substance use:  Not Applicable Psych involvement (Current and /or in the community):  No (Comment)  Discharge Needs  Concerns to be addressed:  No discharge needs identified Readmission within the last 30 days:  No Current discharge risk:  None Barriers to Discharge:  No Barriers Identified   Darleene Cleavernterhaus, Kyland No R 11/28/2015, 4:45 PM

## 2015-11-28 NOTE — Clinical Social Work Note (Signed)
[facility], MSW [facility] to [facility] [phone] who said they can accept patient back over the weekend if she is medically ready for discharge and orders have been received.  [facility] and discharge summary be faxed to [phone] once she is ready for discharge.  MSW to continue to follow patient's progress throughout discharge planning.  Ervin KnackEric R. Jaicey Sweaney, MSW [phone]  Mon-Fri 8a-4:30p 11/28/2015 5:00 PM

## 2015-11-28 NOTE — Care Management (Signed)
Spoke with friend, Sherron FlemingsMargaret Patel [phone]). She states patient is from [facility]. CSW updated. PT consult pending.

## 2015-11-28 NOTE — Progress Notes (Signed)
[facility] [place] [facility]   PATIENT NAME: Dominique HeadsSally Patel    [facility]  [id number]  DATE OF BIRTH:  1934-12-05  SUBJECTIVE:  CHIEF COMPLAINT:   Chief Complaint  Patient presents with  . Shortness of Breath  . Cough  . Dementia   -Admitted with weakness and pneumonia -Fevers are better. Urine with infection, still has cough.  REVIEW OF SYSTEMS:  Review of Systems  Constitutional: Positive for fever and malaise/fatigue. Negative for chills.  HENT: Negative for congestion, ear discharge, ear pain and nosebleeds.   Respiratory: Positive for cough, shortness of breath and wheezing.   Cardiovascular: Negative for chest pain and palpitations.  Gastrointestinal: Negative for abdominal pain, constipation, diarrhea, nausea and vomiting.  Genitourinary: Negative for dysuria and urgency.  Musculoskeletal: Negative for myalgias.  Neurological: Negative for dizziness, sensory change, speech change, focal weakness, seizures and headaches.  Psychiatric/Behavioral: Negative for depression.    DRUG ALLERGIES:  No Known Allergies  VITALS:  Blood pressure 102/67, pulse (!) 59, temperature 98.6 F (37 C), temperature source Oral, resp. rate 16, height 5\' 4"  (1.626 m), weight 81.6 kg (180 lb), SpO2 98 %.  PHYSICAL EXAMINATION:  Physical Exam  GENERAL:  81 y.o.-year-old patient lying in the bed with no acute distress. Emotional at times. EYES: Pupils equal, round, reactive to light and accommodation. No scleral icterus. Extraocular muscles intact.  HEENT: Head atraumatic, normocephalic. Oropharynx and nasopharynx clear.  NECK:  Supple, no jugular venous distention. No thyroid enlargement, no tenderness.  LUNGS: Diffuse scattered expiratory wheezing bilaterally, fine rhonchi at the bases. No results or crackles. No use of accessory muscles to breathe.  CARDIOVASCULAR: S1, S2 normal. No murmurs, rubs, or gallops.  ABDOMEN: Soft, nontender, nondistended. Bowel sounds  present. No organomegaly or mass.  EXTREMITIES: No pedal edema, cyanosis, or clubbing.  NEUROLOGIC: Cranial nerves II through XII are intact. Muscle strength 5/5 in all extremities. Sensation intact. Gait not checked.  PSYCHIATRIC: The patient is alert and oriented x 2-3. Intermittent periods of confusion noted SKIN: No obvious rash, lesion, or ulcer.    LABORATORY PANEL:   CBC  Recent Labs Lab 11/28/15 0449  WBC 11.6*  HGB 14.1  HCT 41.3  PLT 141*   ------------------------------------------------------------------------------------------------------------------  Chemistries   Recent Labs Lab 11/27/15 1927 11/28/15 0449 11/28/15 1047  NA 138 137  --   K 3.2* 2.9* 3.1*  CL 98* 99*  --   CO2 32 30  --   GLUCOSE 156* 119*  --   BUN 25* 22*  --   CREATININE 0.77 0.55  --   CALCIUM 9.3 8.5*  --   AST 35  --   --   ALT 20  --   --   ALKPHOS 68  --   --   BILITOT 1.2  --   --    ------------------------------------------------------------------------------------------------------------------  Cardiac Enzymes  Recent Labs Lab 11/27/15 1927  TROPONINI <0.03   ------------------------------------------------------------------------------------------------------------------  RADIOLOGY:  Dg Chest 2 View  Result Date: 11/27/2015 CLINICAL DATA:  [facility] with reports of shortness of breath, wheezing and a cough Pt with a history of dementia EXAM: CHEST  2 VIEW COMPARISON:  None. FINDINGS: The cardiac silhouette is normal in size and configuration. No mediastinal or hilar masses or evidence of adenopathy. Lungs are hyperexpanded but clear. No pleural effusion or pneumothorax. Skeletal structures are demineralized but grossly intact. IMPRESSION: No acute cardiopulmonary disease. Electronically Signed   By: Amie Portlandavid  Ormond  M.D.   On: 11/27/2015 19:55    EKG:   Orders placed or performed during the hospital encounter of 11/27/15  . ED EKG  12-Lead  . ED EKG 12-Lead  . EKG 12-Lead  . EKG 12-Lead    ASSESSMENT AND PLAN:   63 F with past medical history significant for dementia, hypertension, anxiety presented to the hospital secondary to cough and fevers.  #1 acute cystitis-follow up cultures. -Currently on cefepime  #2 acute bronchitis-chest x-ray with no pneumonia. Has reactive airway disease and bronchitis. -Already on antibiotics. Add nebulizer treatments.  #3 new-onset atrial fibrillation-patient has known history of A. fib. Change heparin drip to oral eliquis. -Currently rate controlled. On atenolol  #4 dementia with depression-continue home medications. Patient on Seroquel, Zoloft and Aricept  #5 hypokalemia-being replaced  #6 DVT prophylaxis-on eliquis  Physical therapy consulted   All the records are reviewed and case discussed with Care Management/Social Workerr. Management plans discussed with the patient, family and they are in agreement.  CODE STATUS: Full code  TOTAL TIME TAKING CARE OF THIS PATIENT: 38 minutes.   POSSIBLE D/C IN  1-2 DAYS, DEPENDING ON CLINICAL CONDITION.   Ahsha Hinsley M.D on 11/28/2015 at 2:47 PM  Between 7am to 6pm - Pager - [phone]  After 6pm go to [email or web address] - password [employer]  [facility]  Office  [phone]  CC: Primary care physician; Pcp Not In System

## 2015-11-28 NOTE — Care Management Important Message (Signed)
Important Message  Patient Details  Name: Dominique Patel MRN: [id number] Date of Birth: July 07, 1934   Medicare Important Message Given:  Yes    Marily MemosLisa M Mclean Moya, RN 11/28/2015, 9:00 AM

## 2015-11-28 NOTE — Evaluation (Signed)
Physical Therapy Evaluation Patient Details Name: Dominique Patel MRN: [id number] DOB: 09-13-1934 Today's Date: 11/28/2015   History of Present Illness  Pt is a 81 year old female presenting from ALF with SOB, wheezing, and a cough. Admitted with pneumonia and new onset AFib. PMH significant for HTN, dementia, and depression.  Clinical Impression  Prior to admission, pt was indep versus mod indep with all functional mobility and ADLs; occasionally requiring handheld assist when ambulating.  Pt lives in an ALF and is not required to negotiate any stairs.  Currently, pt is mod I with bed mobility, supervision for sit <> stand, and min guard to ambulate 52ft (62ft with RW, 42ft no AD). Pt moderately fatigued following ambulation, HR up to 89 bpm from 76 bpm.  Pt scored 26/28 on the Tinetti Balance Assessment, indicating that she is a low risk for falls.  Pt would benefit from skilled PT to address decreased activity tolerance and prevent deconditioning while pt is still admitted.  Recommend pt discharge back to ALF when medically appropriate.   Follow Up Recommendations No PT follow up    Equipment Recommendations  None recommended by PT    Recommendations for Other Services       Precautions / Restrictions Precautions Precautions: Fall Restrictions Weight Bearing Restrictions: No      Mobility  Bed Mobility Overal bed mobility: Modified Independent General bed mobility comments: HOB elevated and increased time, pt able to achieve supine > sit mod I.  Transfers Overall transfer level: Needs assistance Equipment used: Rolling walker (2 wheeled) Transfers: Sit to/from Stand Sit to Stand: Supervision General transfer comment: Supervision for safety and vc's for appropriate DME use. Ultimately assumes full stand without physical assist.  Ambulation/Gait Ambulation/Gait assistance: Min guard Ambulation Distance (Feet): 30 Feet Assistive device: Rolling walker (2 wheeled);None Gait  Pattern/deviations: Step-through pattern;Decreased stride length;Narrow base of support Gait velocity: Decreased General Gait Details: Pt ambulates slow and steady. Initially using RW, but unnecesasry and pt able to ambulate 35ft without AD with min guard for safety. Decreased step length and arm swing bilaterally, but overall safe with no LOB.  Stairs    Wheelchair Mobility    Modified Rankin (Stroke Patients Only)     Balance Overall balance assessment: Modified Independent  Tinetti Balance Assessment was administered and pt scored as follows: Balance sub scale:  14/16 Gait sub scale:  12/12 Total Score:  26/28  Deficits observed: pt utilizes UE assist to assume stand and sit.      Pertinent Vitals/Pain Pain Assessment: No/denies pain  HR 76bpm at rest, up to 89 bpm with activity. SaO2 WFL throughout.    Home Living Family/patient expects to be discharged to:: Assisted living Home Equipment: Dan Humphreys - 2 wheels;Cane - single point Additional Comments: Memory care at [facility]    Prior Function Level of Independence: Independent  Comments: Pt is questionable historian; per her report, she ambulates independently or with handheld assist. Does not require assist for ADLs.      Hand Dominance        Extremity/Trunk Assessment   Upper Extremity Assessment: Overall WFL for tasks assessed  Lower Extremity Assessment: Overall WFL for tasks assessed  Cervical / Trunk Assessment: Normal    Communication   Communication: No difficulties  Cognition Arousal/Alertness: Awake/alert Behavior During Therapy: WFL for tasks assessed/performed Overall Cognitive Status: Within Functional Limits for tasks assessed (A&O x4. Tangential conversation but easily redirected.)    General Comments Nursing cleared pt for participation in physical therapy.  Pt agreeable to PT session.     Exercises General Exercises - Lower Extremity Ankle Circles/Pumps: AROM Quad Sets: AROM Gluteal  Sets: AROM Short Arc Quad: AROM Heel Slides: AROM Hip ABduction/ADduction: AROM Straight Leg Raises: AROM  All exercises performed bilaterally x 10 reps in supine.       Assessment/Plan    PT Assessment Patient needs continued PT services  PT Diagnosis  (Decreased activity tolerance)   PT Problem List Decreased activity tolerance;Decreased mobility  PT Treatment Interventions Gait training;Functional mobility training;Therapeutic activities;Therapeutic exercise;Patient/family education   PT Goals (Current goals can be found in the Care Plan section) Acute Rehab PT Goals Patient Stated Goal: To go back home PT Goal Formulation: With patient Time For Goal Achievement: 12/12/15 Potential to Achieve Goals: Good    Frequency Min 2X/week   Barriers to discharge           End of Session Equipment Utilized During Treatment: Gait belt Activity Tolerance: Patient tolerated treatment well Patient left: in chair;with call bell/phone within reach;with chair alarm set Nurse Communication: Mobility status;Precautions         Time: [id number] PT Time Calculation (min) (ACUTE ONLY): 17 min   Charges:         PT G Codes:        Chella Chapdelaine, SPT 11/28/2015, 4:16 PM

## 2015-11-28 NOTE — Progress Notes (Signed)
Paged Dr. Nemiah CommanderKalisetti regarding patient's recent low magnesium value of 1.6. Awaiting possible response / new order(s) at this time. Jari FavreSteven M [facility]

## 2015-11-29 LAB — BASIC METABOLIC PANEL
ANION GAP: 8 (ref 5–15)
BUN: 22 mg/dL — ABNORMAL HIGH (ref 6–20)
CALCIUM: 8.6 mg/dL — AB (ref 8.9–10.3)
CO2: 29 mmol/L (ref 22–32)
Chloride: 102 mmol/L (ref 101–111)
Creatinine, Ser: 0.59 mg/dL (ref 0.44–1.00)
Glucose, Bld: 95 mg/dL (ref 65–99)
Potassium: 3.6 mmol/L (ref 3.5–5.1)
Sodium: 139 mmol/L (ref 135–145)

## 2015-11-29 LAB — URINE CULTURE: CULTURE: NO GROWTH

## 2015-11-29 MED ORDER — MOMETASONE FURO-FORMOTEROL FUM 200-5 MCG/ACT IN AERO: 2.0000 | INHALATION_SPRAY | Freq: Two times a day (BID) | RESPIRATORY_TRACT | 11 refills | Status: DC

## 2015-11-29 MED ORDER — ALBUTEROL SULFATE (2.5 MG/3ML) 0.083% IN NEBU: 2.5000 mg | INHALATION_SOLUTION | Freq: Four times a day (QID) | RESPIRATORY_TRACT | 12 refills | Status: DC | PRN

## 2015-11-29 MED ORDER — BENZONATATE 100 MG PO CAPS: 100.0000 mg | ORAL_CAPSULE | Freq: Three times a day (TID) | ORAL | 0 refills | Status: DC

## 2015-11-29 MED ORDER — HYDROCOD POLST-CPM POLST ER 10-8 MG/5ML PO SUER: 5.0000 mL | Freq: Two times a day (BID) | ORAL | 0 refills | Status: DC

## 2015-11-29 MED ORDER — ATENOLOL 50 MG PO TABS: 50.0000 mg | ORAL_TABLET | Freq: Every day | ORAL | 2 refills | Status: DC

## 2015-11-29 MED ORDER — APIXABAN 5 MG PO TABS: 5.0000 mg | ORAL_TABLET | Freq: Two times a day (BID) | ORAL | 2 refills | Status: AC

## 2015-11-29 MED ORDER — HYDROCOD POLST-CPM POLST ER 10-8 MG/5ML PO SUER
5.0000 mL | Freq: Two times a day (BID) | ORAL | Status: DC
  Administered 2015-11-29: 5 mL via ORAL
  Filled 2015-11-29: qty 5

## 2015-11-29 MED ORDER — MOMETASONE FURO-FORMOTEROL FUM 200-5 MCG/ACT IN AERO
2.0000 | INHALATION_SPRAY | Freq: Two times a day (BID) | RESPIRATORY_TRACT | Status: DC
  Administered 2015-11-29: 2 via RESPIRATORY_TRACT
  Filled 2015-11-29: qty 8.8

## 2015-11-29 MED ORDER — LEVOFLOXACIN 500 MG PO TABS: 500.0000 mg | ORAL_TABLET | Freq: Every day | ORAL | 0 refills | Status: DC

## 2015-11-29 NOTE — Progress Notes (Signed)
Patient is being discharge in a stable condition, report given to floor nurse , pick up by EMS .

## 2015-11-29 NOTE — Clinical Social Work Note (Signed)
[facility] Patient's contact, Claris CheMargaret [phone], and facility aware. CSW available for any additional dc needs.  Argentina PonderKaren Martha Niquita Digioia, MSW, LCSW-A [phone]

## 2015-11-29 NOTE — Discharge Summary (Signed)
[employer] - [place] at [facility]   PATIENT NAME: Dominique Patel    MR#:  [id number]  DATE OF BIRTH:  January 18, 1935  DATE OF ADMISSION:  11/27/2015   ADMITTING PHYSICIAN: Oralia Manis, MD  DATE OF DISCHARGE: 11/29/2015  PRIMARY CARE PHYSICIAN: Pcp Not In System   ADMISSION DIAGNOSIS:   Tachypnea [R06.82] Leukocytosis [D72.829] HCAP (healthcare-associated pneumonia) [J18.9] Dementia, without behavioral disturbance [F03.90]  DISCHARGE DIAGNOSIS:   Principal Problem:   HCAP (healthcare-associated pneumonia) Active Problems:   Major depressive disorder (HCC)   Dementia with psychosis   HTN (hypertension)   New onset atrial fibrillation (HCC)   SECONDARY DIAGNOSIS:   Past Medical History:  Diagnosis Date  . Anxiety disorder   . Cataracts, bilateral   . Dementia   . Hypertension   . Major depressive disorder [facility])     HOSPITAL COURSE:   41 F with past medical history significant for dementia, hypertension, anxiety presented to the hospital secondary to cough and fevers.  #1 acute cystitis-pending urine cultures. -on cefepime-changed to Levaquin at discharge  #2 acute bronchitis-chest x-ray with no pneumonia. Has reactive airway disease and bronchitis. -Already on antibiotics. Added Dulera and nebulizer treatments. -Cough meds added  #3 new-onset atrial fibrillation-patient has known history of A. fib. Based on her [place] score, started on eliquis. -Currently rate controlled. On atenolol  #4 dementia with depression-continue home medications. Patient on Seroquel, Zoloft and Aricept  #5 hypokalemia-replaced, discontinue chlorthalidone at discharge  Worked with physical therapy, safe to be discharged back to assisted living today.  DISCHARGE CONDITIONS:   Guarded CONSULTS OBTAINED:    none  DRUG ALLERGIES:   No Known Allergies DISCHARGE MEDICATIONS:     Medication List    STOP taking these medications   aspirin EC 81 MG  tablet   atenolol-chlorthalidone 50-25 MG tablet Commonly known as:  TENORETIC Replaced by:  atenolol 50 MG tablet     TAKE these medications   acetaminophen 325 MG tablet Commonly known as:  TYLENOL Take 650 mg by mouth every 6 (six) hours as needed.   albuterol (2.5 MG/3ML) 0.083% nebulizer solution Commonly known as:  PROVENTIL Take 3 mLs (2.5 mg total) by nebulization every 6 (six) hours as needed for wheezing or shortness of breath.   apixaban 5 MG Tabs tablet Commonly known as:  ELIQUIS Take 1 tablet (5 mg total) by mouth 2 (two) times daily.   atenolol 50 MG tablet Commonly known as:  TENORMIN Take 1 tablet (50 mg total) by mouth daily. Replaces:  atenolol-chlorthalidone 50-25 MG tablet   benzonatate 100 MG capsule Commonly known as:  TESSALON PERLES Take 1 capsule (100 mg total) by mouth 3 (three) times daily. X 7 days   chlorpheniramine-HYDROcodone 10-8 MG/5ML Suer Commonly known as:  TUSSIONEX Take 5 mLs by mouth every 12 (twelve) hours. X 7 days   donepezil 5 MG tablet Commonly known as:  ARICEPT Take 1 tablet by mouth daily.   levofloxacin 500 MG tablet Commonly known as:  LEVAQUIN Take 1 tablet (500 mg total) by mouth daily. X 7 days   mometasone-formoterol 200-5 MCG/ACT Aero Commonly known as:  DULERA Inhale 2 puffs into the lungs 2 (two) times daily.   QUEtiapine 50 MG tablet Commonly known as:  SEROQUEL Take 50 mg by mouth 2 (two) times daily.   sertraline 50 MG tablet Commonly known as:  ZOLOFT Take 1 tablet by mouth daily.   vitamin B-12 1000 MCG tablet Commonly known as:  CYANOCOBALAMIN Take 1,000 mcg by mouth daily.        DISCHARGE INSTRUCTIONS:   1. PCP follow-up in 1-2 weeks  DIET:   Cardiac diet  ACTIVITY:   Activity as tolerated  OXYGEN:   Home Oxygen: No.  Oxygen Delivery: room air  DISCHARGE LOCATION:   Assisted living facility   If you experience worsening of your admission symptoms, develop shortness of  breath, life threatening emergency, suicidal or homicidal thoughts you must seek medical attention immediately by calling 911 or calling your MD immediately  if symptoms less severe.  You Must read complete instructions/literature along with all the possible adverse reactions/side effects for all the Medicines you take and that have been prescribed to you. Take any new Medicines after you have completely understood and accpet all the possible adverse reactions/side effects.   Please note  You were cared for by a hospitalist during your hospital stay. If you have any questions about your discharge medications or the care you received while you were in the hospital after you are discharged, you can call the unit and asked to speak with the hospitalist on call if the hospitalist that took care of you is not available. Once you are discharged, your primary care physician will handle any further medical issues. Please note that NO REFILLS for any discharge medications will be authorized once you are discharged, as it is imperative that you return to your primary care physician (or establish a relationship with a primary care physician if you do not have one) for your aftercare needs so that they can reassess your need for medications and monitor your lab values.    On the day of Discharge:  VITAL SIGNS:   Blood pressure 139/65, pulse 76, temperature 98.2 F (36.8 C), temperature source Oral, resp. rate 20, height 5\' 4"  (1.626 m), weight 81.6 kg (180 lb), SpO2 97 %.  PHYSICAL EXAMINATION:    GENERAL:  81 y.o.-year-old patient lying in the bed with no acute distress. Frustrated with the cough but feels much stronger. EYES: Pupils equal, round, reactive to light and accommodation. No scleral icterus. Extraocular muscles intact.  HEENT: Head atraumatic, normocephalic. Oropharynx and nasopharynx clear.  NECK:  Supple, no jugular venous distention. No thyroid enlargement, no tenderness.  LUNGS: Moving  air bilaterally. Normal breath sounds. No wheezing, rhonchi or crackles. No use of accessory muscles to breathe.  CARDIOVASCULAR: S1, S2 normal. No murmurs, rubs, or gallops.  ABDOMEN: Soft, nontender, nondistended. Bowel sounds present. No organomegaly or mass.  EXTREMITIES: No pedal edema, cyanosis, or clubbing.  NEUROLOGIC: Cranial nerves II through XII are intact. Muscle strength 5/5 in all extremities. Sensation intact. Gait not checked.  PSYCHIATRIC: The patient is alert and oriented x 2-3. Intermittent periods of confusion noted SKIN: No obvious rash, lesion, or ulcer.    DATA REVIEW:   CBC  Recent Labs Lab 11/28/15 0449  WBC 11.6*  HGB 14.1  HCT 41.3  PLT 141*    Chemistries   Recent Labs Lab 11/27/15 1927  11/28/15 1047 11/29/15 0529  NA 138  < >  --  139  K 3.2*  < > 3.1* 3.6  CL 98*  < >  --  102  CO2 32  < >  --  29  GLUCOSE 156*  < >  --  95  BUN 25*  < >  --  22*  CREATININE 0.77  < >  --  0.59  CALCIUM 9.3  < >  --  8.6*  MG  --   --  1.6*  --   AST 35  --   --   --   ALT 20  --   --   --   ALKPHOS 68  --   --   --   BILITOT 1.2  --   --   --   < > = values in this interval not displayed.   Microbiology Results  Results for orders placed or performed during the hospital encounter of 11/27/15  Blood Culture (routine x 2)     Status: None (Preliminary result)   Collection Time: 11/27/15  8:32 PM  Result Value Ref Range Status   Specimen Description BLOOD LEFT ASSIST CONTROL  Final   Special Requests BOTTLES DRAWN AEROBIC AND ANAEROBIC 8CC  Final   Culture NO GROWTH 2 DAYS  Final   Report Status PENDING  Incomplete  Blood Culture (routine x 2)     Status: None (Preliminary result)   Collection Time: 11/27/15  8:34 PM  Result Value Ref Range Status   Specimen Description BLOOD RIGHT HAND  Final   Special Requests BOTTLES DRAWN AEROBIC AND ANAEROBIC 6CC  Final   Culture NO GROWTH 2 DAYS  Final   Report Status PENDING  Incomplete  Culture,  sputum-assessment     Status: None (Preliminary result)   Collection Time: 11/28/15  6:57 PM  Result Value Ref Range Status   Specimen Description EXPECTORATED SPUTUM  Final   Special Requests NONE  Final   Sputum evaluation THIS SPECIMEN IS ACCEPTABLE FOR SPUTUM CULTURE  Final   Report Status PENDING  Incomplete  Culture, respiratory (NON-Expectorated)     Status: None (Preliminary result)   Collection Time: 11/28/15  6:57 PM  Result Value Ref Range Status   Specimen Description EXPECTORATED SPUTUM  Final   Special Requests NONE Reflexed from [id number]  Final   Gram Stain   Final    ABUNDANT WBC PRESENT, PREDOMINANTLY PMN NO ORGANISMS SEEN Performed at [facility]    Culture PENDING  Incomplete   Report Status PENDING  Incomplete    RADIOLOGY:  No results found.   Management plans discussed with the patient, family and they are in agreement.  CODE STATUS:     Code Status Orders        Start     Ordered   11/27/15 2203  Full code  Continuous     11/27/15 2202    Code Status History    Date Active Date Inactive Code Status Order ID Comments User Context   This patient has a current code status but no historical code status.      TOTAL TIME TAKING CARE OF THIS PATIENT: 37 minutes.    Ily Denno M.D on 11/29/2015 at 9:30 AM  Between 7am to 6pm - Pager - [phone]  After 6pm go to [email or web address] - Social research officer, government  [employer] [place] [facility]  Office  [phone]  CC: Primary care physician; Pcp Not In System   Note: This dictation was prepared with Dragon dictation along with smaller phrase technology. Any transcriptional errors that result from this process are unintentional.

## 2015-12-01 LAB — CULTURE, RESPIRATORY

## 2015-12-01 LAB — CULTURE, RESPIRATORY W GRAM STAIN: Culture: NORMAL

## 2015-12-02 LAB — CULTURE, BLOOD (ROUTINE X 2)
CULTURE: NO GROWTH
Culture: NO GROWTH

## 2015-12-05 LAB — EXPECTORATED SPUTUM ASSESSMENT W GRAM STAIN, RFLX TO RESP C

## 2015-12-05 LAB — EXPECTORATED SPUTUM ASSESSMENT W REFEX TO RESP CULTURE

## 2016-02-14 ENCOUNTER — Emergency Department
Admission: EM | Admit: 2016-02-14 | Discharge: 2016-02-14 | Disposition: A | Discharge status: Home / Self Care | Payer: Medicare Other | Attending: Emergency Medicine | Admitting: Emergency Medicine

## 2016-02-14 DIAGNOSIS — Y939 Activity, unspecified: Secondary | ICD-10-CM | POA: Diagnosis not present

## 2016-02-14 DIAGNOSIS — W1809XA Striking against other object with subsequent fall, initial encounter: Secondary | ICD-10-CM | POA: Diagnosis not present

## 2016-02-14 DIAGNOSIS — Y9289 Other specified places as the place of occurrence of the external cause: Secondary | ICD-10-CM | POA: Insufficient documentation

## 2016-02-14 DIAGNOSIS — Z79899 Other long term (current) drug therapy: Secondary | ICD-10-CM | POA: Insufficient documentation

## 2016-02-14 DIAGNOSIS — I10 Essential (primary) hypertension: Secondary | ICD-10-CM | POA: Insufficient documentation

## 2016-02-14 DIAGNOSIS — S0990XA Unspecified injury of head, initial encounter: Secondary | ICD-10-CM | POA: Diagnosis not present

## 2016-02-14 DIAGNOSIS — Y999 Unspecified external cause status: Secondary | ICD-10-CM | POA: Diagnosis not present

## 2016-02-14 DIAGNOSIS — W19XXXA Unspecified fall, initial encounter: Secondary | ICD-10-CM

## 2016-02-14 NOTE — ED Notes (Signed)
[facility] was called to transfer patient to [facility] @ 2105.

## 2016-02-14 NOTE — ED Notes (Signed)
Pt awaiting transport back to [facility]  

## 2016-02-14 NOTE — ED Provider Notes (Signed)
[facility] Emergency Department Provider Note        Time seen: ----------------------------------------- 6:51 PM on 02/14/2016 -----------------------------------------    I have reviewed the triage vital signs and the nursing notes.   HISTORY  Chief Complaint Fall    HPI Dominique Patel is a 81 y.o. female who presents the ER after a [facility]. Patient reportedly fell and hit her head on a chair. She is on Eliquis, she denies any complaints or pain. Denies headache.   Past Medical History:  Diagnosis Date  . Anxiety disorder   . Cataracts, bilateral   . Dementia   . Hypertension   . Major depressive disorder     Patient Active Problem List   Diagnosis Date Noted  . HCAP (healthcare-associated pneumonia) 11/27/2015  . HTN (hypertension) 11/27/2015  . New onset atrial fibrillation (HCC) 11/27/2015  . Major depressive disorder 08/20/2015  . Dementia with psychosis 08/20/2015    Past Surgical History:  Procedure Laterality Date  . ABDOMINAL HYSTERECTOMY      Allergies Patient has no known allergies.  Social History Social History  Substance Use Topics  . Smoking status: Never Smoker  . Smokeless tobacco: Not on file  . Alcohol use No    Review of Systems Constitutional: Negative for fever. Cardiovascular: Negative for chest pain. Respiratory: Negative for shortness of breath. Gastrointestinal: Negative for abdominal pain, vomiting and diarrhea. Genitourinary: Negative for dysuria. Musculoskeletal: Negative for back pain. Skin: Negative for rash. Neurological: Negative for headaches, focal weakness or numbness.  10-point ROS otherwise negative.  ____________________________________________   PHYSICAL EXAM:  VITAL SIGNS: ED Triage Vitals  Enc Vitals Group     BP 02/14/16 1842 128/60     Pulse Rate 02/14/16 1842 (!) 59     Resp 02/14/16 1842 17     Temp 02/14/16 1842 98.4 F (36.9 C)   Temp Source 02/14/16 1842 Oral     SpO2 02/14/16 1842 99 %     Weight 02/14/16 1843 195 lb 3.2 oz (88.5 kg)     Height --      Head Circumference --      Peak Flow --      Pain Score --      Pain Loc --      Pain Edu? --      Excl. in GC? --     Constitutional: Alert and oriented. Well appearing and in no distress. Eyes: Conjunctivae are normal. PERRL. Normal extraocular movements. ENT   Head: Normocephalic, small erythema in the frontal scalp   Nose: No congestion/rhinnorhea.   Mouth/Throat: Mucous membranes are moist.   Neck: No stridor. Cardiovascular: Normal rate, regular rhythm. No murmurs, rubs, or gallops. Respiratory: Normal respiratory effort without tachypnea nor retractions. Breath sounds are clear and equal bilaterally. No wheezes/rales/rhonchi. Gastrointestinal: Soft and nontender. Normal bowel sounds Musculoskeletal: Nontender with normal range of motion in all extremities. No lower extremity tenderness nor edema. Neurologic:  Normal speech and language. No gross focal neurologic deficits are appreciated.  Skin:  Skin is warm, dry and intact. No rash noted. Psychiatric: Mood and affect are normal. Speech and behavior are normal.  ____________________________________________  ED COURSE:  Pertinent labs & imaging results that were available during my care of the patient were reviewed by me and considered in my medical decision making (see chart for details). Clinical Course   Patients in no distress, she is able to ambulate without difficulty. We will observe and likely  discharged without any further treatment.  Procedures ____________________________________________  FINAL ASSESSMENT AND PLAN  Fall, minor head injury  Plan: Patient with a minor fall. She is at her baseline, is able to ambulate without difficulty using a cane. She is stable for outpatient follow-up.   Emily FilbertWilliams, Yara Tomkinson E, MD   Note: This dictation was prepared with Dragon  dictation. Any transcriptional errors that result from this process are unintentional    Emily FilbertJonathan E Helvi Royals, MD 02/14/16 [phone]

## 2016-02-14 NOTE — ED Triage Notes (Signed)
Per EMS report, [facility] and hit forehead on chair. Patient is on Eliquis BID

## 2016-02-14 NOTE — ED Notes (Signed)
Pt transported back to [facility] with paperwork in hand

## 2016-06-09 ENCOUNTER — Emergency Department: Payer: Medicare Other

## 2016-06-09 ENCOUNTER — Emergency Department
Admission: EM | Admit: 2016-06-09 | Discharge: 2016-06-09 | Disposition: A | Discharge status: Home / Self Care | Payer: Medicare Other | Attending: Emergency Medicine | Admitting: Emergency Medicine

## 2016-06-09 ENCOUNTER — Encounter: Payer: Self-pay | Admitting: *Deleted

## 2016-06-09 DIAGNOSIS — R079 Chest pain, unspecified: Secondary | ICD-10-CM

## 2016-06-09 DIAGNOSIS — Z79899 Other long term (current) drug therapy: Secondary | ICD-10-CM | POA: Diagnosis not present

## 2016-06-09 DIAGNOSIS — I10 Essential (primary) hypertension: Secondary | ICD-10-CM | POA: Diagnosis not present

## 2016-06-09 DIAGNOSIS — R0789 Other chest pain: Secondary | ICD-10-CM | POA: Insufficient documentation

## 2016-06-09 DIAGNOSIS — R0602 Shortness of breath: Secondary | ICD-10-CM | POA: Diagnosis not present

## 2016-06-09 LAB — CBC WITH DIFFERENTIAL/PLATELET
Basophils Absolute: 0 10*3/uL (ref 0–0.1)
Basophils Relative: 1 %
EOS ABS: 0.1 10*3/uL (ref 0–0.7)
EOS PCT: 2 %
HCT: 42.7 % (ref 35.0–47.0)
Hemoglobin: 14.3 g/dL (ref 12.0–16.0)
LYMPHS ABS: 1.9 10*3/uL (ref 1.0–3.6)
LYMPHS PCT: 27 %
MCH: 30.2 pg (ref 26.0–34.0)
MCHC: 33.6 g/dL (ref 32.0–36.0)
MCV: 90.1 fL (ref 80.0–100.0)
MONO ABS: 1.1 10*3/uL — AB (ref 0.2–0.9)
MONOS PCT: 16 %
Neutro Abs: 3.9 10*3/uL (ref 1.4–6.5)
Neutrophils Relative %: 54 %
PLATELETS: 130 10*3/uL — AB (ref 150–440)
RBC: 4.74 MIL/uL (ref 3.80–5.20)
RDW: 13.9 % (ref 11.5–14.5)
WBC: 7.1 10*3/uL (ref 3.6–11.0)

## 2016-06-09 LAB — BRAIN NATRIURETIC PEPTIDE: B NATRIURETIC PEPTIDE 5: 201 pg/mL — AB (ref 0.0–100.0)

## 2016-06-09 LAB — BASIC METABOLIC PANEL
Anion gap: 7 (ref 5–15)
BUN: 23 mg/dL — AB (ref 6–20)
CO2: 29 mmol/L (ref 22–32)
CREATININE: 0.79 mg/dL (ref 0.44–1.00)
Calcium: 9.1 mg/dL (ref 8.9–10.3)
Chloride: 103 mmol/L (ref 101–111)
GFR calc Af Amer: >60 mL/min (ref >60)
GLUCOSE: 105 mg/dL — AB (ref 65–99)
POTASSIUM: 3.9 mmol/L (ref 3.5–5.1)
SODIUM: 139 mmol/L (ref 135–145)

## 2016-06-09 LAB — TROPONIN I
Troponin I: <0.03 ng/mL (ref <0.03)
Troponin I: <0.03 ng/mL (ref <0.03)

## 2016-06-09 NOTE — Discharge Instructions (Signed)

## 2016-06-09 NOTE — ED Notes (Signed)
Patient assisted with getting dressed and ambulated to the restroom.  Patient denies any other needs at this time.

## 2016-06-09 NOTE — ED Provider Notes (Signed)
[facility] Emergency Department Provider Note  ____________________________________________  Time seen: Approximately 11:40 AM  I have reviewed the triage vital signs and the nursing notes.   HISTORY  Chief Complaint Chest Pain   HPI Dominique Patel is a 82 y.o. female with h/o dementia, atrial fibrillation on Eliquis, and hypertensionwho presents for evaluation of chest pain. Patient reports that she was diagnosed with flu 5 weeks ago and since then she has had shortness of breath mainly with exertion. She feels like she is recovering but is still not back to her baseline. This morning she reports that she woke up feeling like her normal self and had breakfast. Soon after she started having a discomfort in her chest. Patient is unable to explain exactly what she felt. She tells me "I felt weird in my chest" and also felt more short of breath. She denies dizziness, nausea, vomiting, diaphoresis. She reports the episode lasted several minutes and by the time EMS arrived he had resolved. Patient has no known history of coronary artery disease. She is not a smoker. She denies cough, congestion, abdominal pain, nausea, vomiting, diarrhea, headache, chest pain or back pain. She does report that she feels a little bit weak. No fever or chills. No dysuria or hematuria.   Past Medical History:  Diagnosis Date  . Anxiety disorder   . Cataracts, bilateral   . Dementia   . Hypertension   . Major depressive disorder     Patient Active Problem List   Diagnosis Date Noted  . HCAP (healthcare-associated pneumonia) 11/27/2015  . HTN (hypertension) 11/27/2015  . New onset atrial fibrillation (HCC) 11/27/2015  . Major depressive disorder 08/20/2015  . Dementia with psychosis 08/20/2015    Past Surgical History:  Procedure Laterality Date  . ABDOMINAL HYSTERECTOMY      Prior to Admission medications   Medication Sig Start Date End Date Taking? Authorizing Provider    apixaban (ELIQUIS) 5 MG TABS tablet Take 1 tablet (5 mg total) by mouth 2 (two) times daily. 11/29/15  Yes Enid Baasadhika Kalisetti, MD  atenolol (TENORMIN) 50 MG tablet Take 1 tablet (50 mg total) by mouth daily. 11/29/15  Yes Enid Baasadhika Kalisetti, MD  donepezil (ARICEPT) 10 MG tablet Take 1 tablet by mouth at bedtime.  07/06/15  Yes Historical Provider, MD  PROAIR HFA 108 (90 Base) MCG/ACT inhaler Inhale 1 puff into the lungs every 6 (six) hours as needed for shortness of breath. 04/09/16  Yes Historical Provider, MD  QUEtiapine (SEROQUEL) 25 MG tablet Take 12.5-25 mg by mouth 2 (two) times daily. Take one-half tablet (12.5 mg) by mouth in the morning and take one tablet (25 mg) by mouth at bedtime.   Yes Historical Provider, MD  sertraline (ZOLOFT) 50 MG tablet Take 1 tablet by mouth daily. 07/18/15  Yes Historical Provider, MD  vitamin B-12 (CYANOCOBALAMIN) 1000 MCG tablet Take 1,000 mcg by mouth daily.   Yes Historical Provider, MD  albuterol (PROVENTIL) (2.5 MG/3ML) 0.083% nebulizer solution Take 3 mLs (2.5 mg total) by nebulization every 6 (six) hours as needed for wheezing or shortness of breath. Patient not taking: Reported on 06/09/2016 11/29/15   Enid Baasadhika Kalisetti, MD  benzonatate (TESSALON PERLES) 100 MG capsule Take 1 capsule (100 mg total) by mouth 3 (three) times daily. X 7 days Patient not taking: Reported on 06/09/2016 11/29/15   Enid Baasadhika Kalisetti, MD  chlorpheniramine-HYDROcodone (TUSSIONEX) 10-8 MG/5ML SUER Take 5 mLs by mouth every 12 (twelve) hours. X 7 days Patient not taking:  Reported on 06/09/2016 11/29/15   Enid Baas, MD  levofloxacin (LEVAQUIN) 500 MG tablet Take 1 tablet (500 mg total) by mouth daily. X 7 days Patient not taking: Reported on 06/09/2016 11/29/15   Enid Baas, MD  mometasone-formoterol (DULERA) 200-5 MCG/ACT AERO Inhale 2 puffs into the lungs 2 (two) times daily. Patient not taking: Reported on 06/09/2016 11/29/15   Enid Baas, MD    Allergies Patient has no  known allergies.  Family History  Problem Relation Age of Onset  . Alzheimer's disease Brother     Social History Social History  Substance Use Topics  . Smoking status: Never Smoker  . Smokeless tobacco: Not on file  . Alcohol use No    Review of Systems  Constitutional: Negative for fever. Eyes: Negative for visual changes. ENT: Negative for sore throat. Neck: No neck pain  Cardiovascular: + chest pain. Respiratory: + shortness of breath. Gastrointestinal: Negative for abdominal pain, vomiting or diarrhea. Genitourinary: Negative for dysuria. Musculoskeletal: Negative for back pain. Skin: Negative for rash. Neurological: Negative for headaches, weakness or numbness. Psych: No SI or HI  ____________________________________________   PHYSICAL EXAM:  VITAL SIGNS: ED Triage Vitals [06/09/16 1053]  Enc Vitals Group     BP 132/85     Pulse Rate 66     Resp 18     Temp 98.8 F (37.1 C)     Temp Source Oral     SpO2 96 %     Weight 180 lb (81.6 kg)     Height 5\' 2"  (1.575 m)     Head Circumference      Peak Flow      Pain Score      Pain Loc      Pain Edu?      Excl. in GC?     Constitutional: Alert and oriented. Well appearing and in no apparent distress. HEENT:      Head: Normocephalic and atraumatic.         Eyes: Conjunctivae are normal. Sclera is non-icteric. EOMI. PERRL      Mouth/Throat: Mucous membranes are moist.       Neck: Supple with no signs of meningismus. Cardiovascular: Irregularly irregular rhythm, normal rate. No murmurs, gallops, or rubs. 2+ symmetrical distal pulses are present in all extremities. No JVD. Respiratory: Normal respiratory effort. Lungs are clear to auscultation bilaterally. No wheezes, crackles, or rhonchi.  Gastrointestinal: Soft, non tender, and non distended with positive bowel sounds. No rebound or guarding. Musculoskeletal: Nontender with normal range of motion in all extremities. No edema, cyanosis, or erythema of  extremities. Neurologic: Normal speech and language. Face is symmetric. Moving all extremities. No gross focal neurologic deficits are appreciated. Skin: Skin is warm, dry and intact. No rash noted. Psychiatric: Mood and affect are normal. Speech and behavior are normal.  ____________________________________________   LABS (all labs ordered are listed, but only abnormal results are displayed)  Labs Reviewed  CBC WITH DIFFERENTIAL/PLATELET - Abnormal; Notable for the following:       Result Value   Platelets 130 (*)    Monocytes Absolute 1.1 (*)    All other components within normal limits  BASIC METABOLIC PANEL - Abnormal; Notable for the following:    Glucose, Bld 105 (*)    BUN 23 (*)    All other components within normal limits  BRAIN NATRIURETIC PEPTIDE - Abnormal; Notable for the following:    B Natriuretic Peptide 201.0 (*)    All other components within normal  limits  TROPONIN I  TROPONIN I   ____________________________________________  EKG  ED ECG REPORT I, Nita Sickle, the attending physician, personally viewed and interpreted this ECG.  Afib, rate 71, normal QRS and QTc intervals, normal axis, no ST elevations or depressions. ____________________________________________  RADIOLOGY  CXR: negative  ____________________________________________   PROCEDURES  Procedure(s) performed: None Procedures Critical Care performed:  None ____________________________________________   INITIAL IMPRESSION / ASSESSMENT AND PLAN / ED COURSE   82 y.o. female with h/o dementia, atrial fibrillation on Eliquis, and hypertensionwho presents for evaluation of an episode of central chest discomfort after breakfast associated with SOB. Patient is asymptomatic at this time. Received full dose aspirin per EMS. EKG showing atrial fibrillation with no ischemic changes. Patient was placed on telemetry. We'll cycle cardiac enzymes, will get chest x-ray and basic blood  work. Clinical Course as of Jun 09 1516  Wed Jun 09, 2016  1455 Troponin 2 is negative. Chest x-ray with no acute findings. Patient remains chest pain-free. Patient be discharged home with close follow-up with her primary care doctor for further evaluation.  [CV]    Clinical Course User Index [CV] Nita Sickle, MD    Pertinent labs & imaging results that were available during my care of the patient were reviewed by me and considered in my medical decision making (see chart for details).    ____________________________________________   FINAL CLINICAL IMPRESSION(S) / ED DIAGNOSES  Final diagnoses:  Chest pain, unspecified type      NEW MEDICATIONS STARTED DURING THIS VISIT:  New Prescriptions   No medications on file     Note:  This document was prepared using Dragon voice recognition software and may include unintentional dictation errors.    Nita Sickle, MD 06/09/16 [phone]

## 2016-06-09 NOTE — ED Triage Notes (Addendum)
[facility] with chest pain that began this AM at 0700, states she recently had the flu, states dry cough and SOB, awake and alert upon arrival in no acute distress, EMs gave 324 ASA

## 2016-06-09 NOTE — ED Notes (Signed)
[facility] given to [facility] and Secretary informed that EMS transportation will be needed to discharge patient back to facility.

## 2017-08-12 ENCOUNTER — Other Ambulatory Visit: Payer: Self-pay

## 2017-08-12 ENCOUNTER — Encounter: Payer: Self-pay | Admitting: Emergency Medicine

## 2017-08-12 ENCOUNTER — Emergency Department: Payer: Medicare Other

## 2017-08-12 ENCOUNTER — Emergency Department
Admission: EM | Admit: 2017-08-12 | Discharge: 2017-08-12 | Disposition: A | Discharge status: Home / Self Care | Payer: Medicare Other | Attending: Emergency Medicine | Admitting: Emergency Medicine

## 2017-08-12 DIAGNOSIS — R0602 Shortness of breath: Secondary | ICD-10-CM | POA: Diagnosis present

## 2017-08-12 DIAGNOSIS — F0390 Unspecified dementia without behavioral disturbance: Secondary | ICD-10-CM | POA: Insufficient documentation

## 2017-08-12 DIAGNOSIS — R062 Wheezing: Secondary | ICD-10-CM

## 2017-08-12 DIAGNOSIS — I10 Essential (primary) hypertension: Secondary | ICD-10-CM | POA: Diagnosis not present

## 2017-08-12 DIAGNOSIS — J069 Acute upper respiratory infection, unspecified: Secondary | ICD-10-CM | POA: Diagnosis not present

## 2017-08-12 LAB — BASIC METABOLIC PANEL
ANION GAP: 11 (ref 5–15)
BUN: 18 mg/dL (ref 6–20)
CALCIUM: 9.3 mg/dL (ref 8.9–10.3)
CO2: 25 mmol/L (ref 22–32)
Chloride: 100 mmol/L — ABNORMAL LOW (ref 101–111)
Creatinine, Ser: 0.65 mg/dL (ref 0.44–1.00)
GFR calc Af Amer: >60 mL/min (ref >60)
GFR calc non Af Amer: >60 mL/min (ref >60)
GLUCOSE: 108 mg/dL — AB (ref 65–99)
Potassium: 3.9 mmol/L (ref 3.5–5.1)
Sodium: 136 mmol/L (ref 135–145)

## 2017-08-12 LAB — CBC
HCT: 46.1 % (ref 35.0–47.0)
Hemoglobin: 15.5 g/dL (ref 12.0–16.0)
MCH: 32 pg (ref 26.0–34.0)
MCHC: 33.6 g/dL (ref 32.0–36.0)
MCV: 95.4 fL (ref 80.0–100.0)
PLATELETS: 163 10*3/uL (ref 150–440)
RBC: 4.83 MIL/uL (ref 3.80–5.20)
RDW: 13.1 % (ref 11.5–14.5)
WBC: 12.8 10*3/uL — AB (ref 3.6–11.0)

## 2017-08-12 LAB — TROPONIN I: Troponin I: <0.03 ng/mL (ref <0.03)

## 2017-08-12 MED ORDER — SPACER/AERO-HOLDING CHAMBERS DEVI: 1.0000 | 0 refills | Status: AC | PRN

## 2017-08-12 MED ORDER — AZITHROMYCIN 250 MG PO TABS: ORAL_TABLET | ORAL | 0 refills | Status: AC

## 2017-08-12 MED ORDER — METHYLPREDNISOLONE SODIUM SUCC 125 MG IJ SOLR
125.0000 mg | Freq: Once | INTRAMUSCULAR | Status: AC
  Administered 2017-08-12: 125 mg via INTRAVENOUS
  Filled 2017-08-12: qty 2

## 2017-08-12 MED ORDER — BENZONATATE 100 MG PO CAPS: 100.0000 mg | ORAL_CAPSULE | Freq: Four times a day (QID) | ORAL | 0 refills | Status: DC | PRN

## 2017-08-12 MED ORDER — ALBUTEROL SULFATE HFA 108 (90 BASE) MCG/ACT IN AERS: 2.0000 | INHALATION_SPRAY | RESPIRATORY_TRACT | 0 refills | Status: AC | PRN

## 2017-08-12 MED ORDER — ALBUTEROL SULFATE (2.5 MG/3ML) 0.083% IN NEBU
10.0000 mg/h | INHALATION_SOLUTION | RESPIRATORY_TRACT | Status: DC
  Administered 2017-08-12: 5 mg/h via RESPIRATORY_TRACT
  Filled 2017-08-12: qty 6

## 2017-08-12 NOTE — ED Notes (Signed)
Patient was ambulated on room air. Pulse ox remained at 91-93%. Patient denied shortness of breath with ambulation. Patient was able to converse in complete sentences while ambulating.

## 2017-08-12 NOTE — ED Triage Notes (Signed)
Brought by ems from [facility] for short ness of breath.  Ems gave 2 duoneb and rport wheezing upper right nad decreased breath sounds left lower.  Patient is finishing up a treatment now.  Able to talk with little difficulty.  Coughing at times.  Ems fsbs 121

## 2017-08-12 NOTE — Discharge Instructions (Addendum)
Please drink plenty of fluids to stay well-hydrated.  Please use the albuterol inhaler with the spacer chamber every 4 hours as needed for wheezing, shortness of breath or cough.  Tessalon Perles may also be used for cough.  Please have Dominique Patel follow-up with her primary care physician in 1 to 2 days to reevaluate her cough as well as her oxygen level.  Return to the emergency department for severe pain, shortness of breath, lightheadedness or fainting, fever, vomiting, or any other symptoms concerning to you.

## 2017-08-12 NOTE — ED Notes (Signed)
Patient was discharged with family who were taking her to [facility].

## 2017-08-12 NOTE — ED Provider Notes (Signed)
[facility] Emergency Department Provider Note  ____________________________________________  Time seen: Approximately 3:29 PM  I have reviewed the triage vital signs and the nursing notes.   HISTORY  Chief Complaint Shortness of Breath    HPI Dominique Patel is a 83 y.o. female brought by EMS from a dementia unit, history of A. fib, HTN, presenting for cough, shortness of breath, and generally "not feeling good."  The patient is able to answer some questions but her history is limited due to her dementia.  She reports that she is not having any pain, and states she has had a mild cough and felt short of breath.  EMS reports the patient was hemodynamically stable in route with O2 sats of greater than 95% on the 4 L nasal cannula that the fire department placed on the patient.  She was given 2 nebulizer treatments in route.  Her room air sat was unknown upon arrival.  Past Medical History:  Diagnosis Date  . Anxiety disorder   . Cataracts, bilateral   . Dementia   . Hypertension   . Major depressive disorder     Patient Active Problem List   Diagnosis Date Noted  . HCAP (healthcare-associated pneumonia) 11/27/2015  . HTN (hypertension) 11/27/2015  . New onset atrial fibrillation (HCC) 11/27/2015  . Major depressive disorder 08/20/2015  . Dementia with psychosis 08/20/2015    Past Surgical History:  Procedure Laterality Date  . ABDOMINAL HYSTERECTOMY      Current Outpatient Rx  . Order #: [id number] Class: Historical Med  . Order #: [id number] Class: Normal  . Order #: [id number] Class: Normal  . Order #: [id number] Class: Historical Med  . Order #: [id number] Class: Historical Med  . Order #: [id number] Class: Historical Med  . Order #: [id number] Class: Historical Med  . Order #: [id number] Class: Historical Med  . Order #: [id number] Class: Historical Med  . Order #: [id number] Class: Print  . Order #: [id number] Class: Print  . Order #: [id number] Class: Print  .  Order #: [id number] Class: Print    Allergies Patient has no known allergies.  Family History  Problem Relation Age of Onset  . Alzheimer's disease Brother     Social History Social History   Tobacco Use  . Smoking status: Never Smoker  . Smokeless tobacco: Never Used  Substance Use Topics  . Alcohol use: No  . Drug use: No    Review of Systems Constitutional: No fever/chills.  No lightheadedness or syncope.  Positive general malaise. Eyes: No visual changes.  No eye discharge. ENT: No sore throat.  Positive congestion with rhinorrhea.  No ear pain. Cardiovascular: Denies chest pain. Denies palpitations. Respiratory: Positive shortness of breath.  Positive cough. Gastrointestinal: No abdominal pain.  No nausea, no vomiting.  No diarrhea.  No constipation. Genitourinary: Negative for dysuria. Musculoskeletal: Negative for back pain. Skin: Negative for rash. Neurological: Negative for headaches. No focal numbness, tingling or weakness.  Positive for dementia.    ____________________________________________   PHYSICAL EXAM:  VITAL SIGNS: ED Triage Vitals  Enc Vitals Group     BP      Pulse      Resp      Temp      Temp src      SpO2      Weight      Height      Head Circumference      Peak Flow      Pain Score      Pain Loc      Pain  Edu?      Excl. in GC?     Constitutional: The patient is alert, but states it is 2020 although she knows it is near Mother's Day.  She is able to answer most questions appropriately.  She is chronically ill-appearing and nontoxic.Eyes: Conjunctivae are normal.  EOMI. No scleral icterus. Head: Atraumatic. Nose: No congestion/rhinnorhea. Mouth/Throat: Mucous membranes are moist.  Neck: No stridor.  Supple.  No JVD.  No meningismus. Cardiovascular: Normal rate, regular rhythm. No murmurs, rubs or gallops.  Respiratory: Normal respiratory effort.  No accessory muscle use or retractions.  The patient has fair air exchange on my  exam.  She has mild rales in the right lung base.  She has end expiratory wheezing bilaterally. Gastrointestinal: Obese.  Soft, nontender and nondistended.  No guarding or rebound.  No peritoneal signs. Musculoskeletal: No LE edema. No ttp in the calves or palpable cords.  Negative Homan's sign. Neurologic:  A&Ox2.  Speech is clear.  Face and smile are symmetric.  EOMI.  Moves all extremities well. Skin:  Skin is warm, dry and intact. No rash noted. Psychiatric: Mood and affect are normal.   ____________________________________________   LABS (all labs ordered are listed, but only abnormal results are displayed)  Labs Reviewed  CBC - Abnormal; Notable for the following components:      Result Value   WBC 12.8 (*)    All other components within normal limits  BASIC METABOLIC PANEL - Abnormal; Notable for the following components:   Chloride 100 (*)    Glucose, Bld 108 (*)    All other components within normal limits  TROPONIN I  URINALYSIS, COMPLETE (UACMP) WITH MICROSCOPIC   ____________________________________________  EKG  ED ECG REPORT I, Rockne Menghini, the attending physician, personally viewed and interpreted this ECG.   Date: 08/12/2017  EKG Time: 1532  Rate: 111  Rhythm: atrial fibrillation with rvr  Axis: normal  Intervals:none  ST&T Change: no STEMI  ____________________________________________  RADIOLOGY  Dg Chest 2 View  Result Date: 08/12/2017 CLINICAL DATA:  Shortness of breath EXAM: CHEST - 2 VIEW COMPARISON:  Chest radiograph 06/09/2016 FINDINGS: Unchanged mild cardiomegaly. Shallow lung inflation. Diffuse chronic interstitial prominence without overt pulmonary edema. No focal airspace consolidation, pneumothorax or pleural effusion. IMPRESSION: Cardiomegaly and chronic interstitial coarsening without acute airspace disease. Electronically Signed   By: Deatra Robinson M.D.   On: 08/12/2017 16:16     ____________________________________________   PROCEDURES  Procedure(s) performed: None  Procedures  Critical Care performed: No ____________________________________________   INITIAL IMPRESSION / ASSESSMENT AND PLAN / ED COURSE  Pertinent labs & imaging results that were available during my care of the patient were reviewed by me and considered in my medical decision making (see chart for details).  83 y.o. female with a history of dementia presenting for cough and shortness of breath.  There, there is no evidence for hypoxia although the patient does have rales in the right lung base.  We will evaluate her for pneumonia and consider viral URI as well.  The patient will be given a continuous nebulizer treatment here and reevaluated.  Solu-Medrol has also been ordered.  A cardiac cause for the patient's symptoms is very unlikely as the patient has no history of CAD, CHF, and has no evidence of peripheral edema.  PE is considered but unlikely.  Plan reevaluation for final disposition.  ----------------------------------------- 5:04 PM on 08/12/2017 -----------------------------------------  The patient continues to remain hemodynamically stable.  She has continued to have oxygen  saturations of 94% or greater on room air.  She has received a 5 mg continuous nebulizer treatment and is feeling much better; on reevaluation her wheezing has completely resolved.  The patient's laboratory studies are reassuring with a normal troponin, normal electrolytes, and a white blood cell of 12.8 which is consistent with infection or inflammation, including the likely URI that the patient is experiencing.  Her chest x-ray does not show any infiltrate and pneumonia is less likely.  Given her age, however, we will send her home with a Z-Pak, as well as an albuterol MDI with spacer and Tessalon Perles for her symptoms.  She will follow-up with her primary care physician tomorrow.  I spoken to the patient as  well as her family members, who understand the results as well as the plan.  Follow-up instructions as well as return precautions were discussed.  ____________________________________________  FINAL CLINICAL IMPRESSION(S) / ED DIAGNOSES  Final diagnoses:  Upper respiratory tract infection, unspecified type  Wheezing         NEW MEDICATIONS STARTED DURING THIS VISIT:  New Prescriptions   ALBUTEROL (PROVENTIL HFA;VENTOLIN HFA) 108 (90 BASE) MCG/ACT INHALER    Inhale 2 puffs into the lungs every 4 (four) hours as needed for wheezing or shortness of breath (or cough).   AZITHROMYCIN (ZITHROMAX Z-PAK) 250 MG TABLET    Take 2 tablets (500 mg) on  Day 1,  followed by 1 tablet (250 mg) once daily on Days 2 through 5.   BENZONATATE (TESSALON PERLES) 100 MG CAPSULE    Take 1 capsule (100 mg total) by mouth every 6 (six) hours as needed for cough.   SPACER/AERO-HOLDING CHAMBERS DEVI    1 puff by Does not apply route every 4 (four) hours as needed.      Rockne Menghini, MD 08/12/17 [phone]

## 2017-10-13 ENCOUNTER — Emergency Department: Payer: Medicare Other

## 2017-10-13 ENCOUNTER — Emergency Department
Admission: EM | Admit: 2017-10-13 | Discharge: 2017-10-13 | Disposition: A | Discharge status: Skilled Nursing Facility | Payer: Medicare Other | Attending: Emergency Medicine | Admitting: Emergency Medicine

## 2017-10-13 ENCOUNTER — Encounter: Payer: Self-pay | Admitting: Emergency Medicine

## 2017-10-13 DIAGNOSIS — Z7901 Long term (current) use of anticoagulants: Secondary | ICD-10-CM | POA: Diagnosis not present

## 2017-10-13 DIAGNOSIS — W06XXXA Fall from bed, initial encounter: Secondary | ICD-10-CM | POA: Insufficient documentation

## 2017-10-13 DIAGNOSIS — Y999 Unspecified external cause status: Secondary | ICD-10-CM | POA: Insufficient documentation

## 2017-10-13 DIAGNOSIS — N390 Urinary tract infection, site not specified: Secondary | ICD-10-CM | POA: Diagnosis not present

## 2017-10-13 DIAGNOSIS — Y92122 Bedroom in nursing home as the place of occurrence of the external cause: Secondary | ICD-10-CM | POA: Diagnosis not present

## 2017-10-13 DIAGNOSIS — Z79899 Other long term (current) drug therapy: Secondary | ICD-10-CM | POA: Insufficient documentation

## 2017-10-13 DIAGNOSIS — M25561 Pain in right knee: Secondary | ICD-10-CM | POA: Insufficient documentation

## 2017-10-13 DIAGNOSIS — F0390 Unspecified dementia without behavioral disturbance: Secondary | ICD-10-CM | POA: Insufficient documentation

## 2017-10-13 DIAGNOSIS — Y939 Activity, unspecified: Secondary | ICD-10-CM | POA: Insufficient documentation

## 2017-10-13 DIAGNOSIS — W19XXXA Unspecified fall, initial encounter: Secondary | ICD-10-CM

## 2017-10-13 DIAGNOSIS — I10 Essential (primary) hypertension: Secondary | ICD-10-CM | POA: Insufficient documentation

## 2017-10-13 LAB — URINALYSIS, COMPLETE (UACMP) WITH MICROSCOPIC
BILIRUBIN URINE: NEGATIVE
Bacteria, UA: NONE SEEN
GLUCOSE, UA: NEGATIVE mg/dL
KETONES UR: NEGATIVE mg/dL
Nitrite: NEGATIVE
PROTEIN: NEGATIVE mg/dL
Specific Gravity, Urine: 1.017 (ref 1.005–1.030)
pH: 5 (ref 5.0–8.0)

## 2017-10-13 MED ORDER — FOSFOMYCIN TROMETHAMINE 3 G PO PACK
3.0000 g | PACK | Freq: Once | ORAL | Status: AC
  Administered 2017-10-13: 3 g via ORAL
  Filled 2017-10-13: qty 3

## 2017-10-13 NOTE — ED Notes (Signed)
Ems here to transport pt back to [facility], [facility]. Paperwork with discharge instructions given to paramedics.

## 2017-10-13 NOTE — Discharge Instructions (Addendum)
Urine culture is pending.  You will be notified of any positive results.  Return to the ER for worsening symptoms, persistent vomiting, difficulty breathing or other concerns.

## 2017-10-13 NOTE — ED Notes (Signed)
Pt sleeping. 

## 2017-10-13 NOTE — ED Notes (Signed)
Pt sleeping. Call bell provided to pt on bedrail at right side.

## 2017-10-13 NOTE — ED Notes (Signed)
Pt up to commode for bowel movement and urination.

## 2017-10-13 NOTE — ED Notes (Signed)
Patient transported to CT 

## 2017-10-13 NOTE — ED Triage Notes (Signed)
[facility] post "roll" out of bed this am. Pt c/o right knee pain. Pt on eliquis.

## 2017-10-13 NOTE — ED Provider Notes (Signed)
[facility] Emergency Department Provider Note   ____________________________________________   First MD Initiated Contact with Patient 10/13/17 [phone]     (approximate)  I have reviewed the triage vital signs and the nursing notes.   HISTORY  Chief Complaint Knee Pain    HPI Dominique Patel is a 83 y.o. female brought to the ED via EMS from [facility] unit status post rolling out of bed this morning.  Complains of right knee pain.  Patient does take Eliquis for atrial fibrillation.  Currently states she fell because she rolled out of the bed.  Does not recall if she struck her head or suffered LOC.  Denies headache, vision changes, neck pain, chest pain, shortness of breath, abdominal pain, nausea, vomiting or diarrhea.   Past Medical History:  Diagnosis Date  . Anxiety disorder   . Cataracts, bilateral   . Dementia   . Hypertension   . Major depressive disorder     Patient Active Problem List   Diagnosis Date Noted  . HCAP (healthcare-associated pneumonia) 11/27/2015  . HTN (hypertension) 11/27/2015  . New onset atrial fibrillation (HCC) 11/27/2015  . Major depressive disorder 08/20/2015  . Dementia with psychosis 08/20/2015    Past Surgical History:  Procedure Laterality Date  . ABDOMINAL HYSTERECTOMY      Prior to Admission medications   Medication Sig Start Date End Date Taking? Authorizing Provider  acetaminophen (TYLENOL) 650 MG CR tablet Take 650 mg by mouth 2 (two) times daily.    [provider]  albuterol (PROVENTIL HFA;VENTOLIN HFA) 108 (90 Base) MCG/ACT inhaler Inhale 2 puffs into the lungs every 4 (four) hours as needed for wheezing or shortness of breath (or cough). 08/12/17   Rockne Menghini, MD  apixaban (ELIQUIS) 5 MG TABS tablet Take 1 tablet (5 mg total) by mouth 2 (two) times daily. 11/29/15   Enid Baas, MD  atenolol (TENORMIN) 50 MG tablet Take 1 tablet (50 mg total) by mouth daily.  11/29/15   Enid Baas, MD  benzonatate (TESSALON PERLES) 100 MG capsule Take 1 capsule (100 mg total) by mouth every 6 (six) hours as needed for cough. 08/12/17   Rockne Menghini, MD  cholecalciferol (VITAMIN D) 1000 units tablet Take 2,000 Units by mouth daily.    [provider]  donepezil (ARICEPT) 10 MG tablet Take 1 tablet by mouth at bedtime.  07/06/15   [provider]  QUEtiapine (SEROQUEL) 25 MG tablet Take 12.5 mg by mouth 2 (two) times daily.     [provider]  sertraline (ZOLOFT) 25 MG tablet Take 1 tablet by mouth every other day.     [provider]  Spacer/Aero-Holding Chambers DEVI 1 puff by Does not apply route every 4 (four) hours as needed. 08/12/17   Rockne Menghini, MD  traMADol (ULTRAM) 50 MG tablet Take 25 mg by mouth daily. And 25MG  by mouth every 8 hours as needed for pain    [provider]  vitamin B-12 (CYANOCOBALAMIN) 250 MCG tablet Take 1,000 mcg by mouth daily.     [provider]    Allergies Patient has no known allergies.  Family History  Problem Relation Age of Onset  . Alzheimer's disease Brother     Social History Social History   Tobacco Use  . Smoking status: Never Smoker  . Smokeless tobacco: Never Used  Substance Use Topics  . Alcohol use: No  . Drug use: No    Review of Systems  Constitutional: No fever/chills Eyes: No visual changes. ENT: No sore throat. Cardiovascular: Denies chest pain. Respiratory: Denies shortness of breath. Gastrointestinal: No abdominal pain.  No nausea, no vomiting.  No diarrhea.  No constipation. Genitourinary: Negative for dysuria. Musculoskeletal: Positive for right knee pain.  Negative for back pain. Skin: Negative for rash. Neurological: Negative for headaches, focal weakness or numbness.   ____________________________________________   PHYSICAL EXAM:  VITAL SIGNS: ED Triage Vitals [10/13/17 0145]  Enc Vitals Group     BP  (!) 147/82     Pulse Rate 65     Resp 17     Temp 98 F (36.7 C)     Temp Source Oral     SpO2 97 %     Weight 192 lb (87.1 kg)     Height      Head Circumference      Peak Flow      Pain Score      Pain Loc      Pain Edu?      Excl. in GC?     Constitutional: Asleep, awakened for exam.  Alert and oriented. Well appearing and in no acute distress. Eyes: Conjunctivae are normal. PERRL. EOMI. Head: Atraumatic. Nose: No congestion/rhinnorhea. Mouth/Throat: Mucous membranes are moist.  Oropharynx non-erythematous. Neck: No stridor.  No cervical spine tenderness to palpation. Cardiovascular: Normal rate, regular rhythm. Grossly normal heart sounds.  Good peripheral circulation. Respiratory: Normal respiratory effort.  No retractions. Lungs CTAB. Gastrointestinal: Soft and nontender. No distention. No abdominal bruits. No CVA tenderness. Musculoskeletal:  RLE: No external evidence of injury, hematoma or joint effusion.  Full range of motion without pain.  2+ femoral distal pulses.  Brisk, less than 5-second capillary refill.   Neurologic: Alert and oriented to person and place.  Normal speech and language. No gross focal neurologic deficits are appreciated.  Skin:  Skin is warm, dry and intact. No rash noted. Psychiatric: Mood and affect are normal. Speech and behavior are normal.  ____________________________________________   LABS (all labs ordered are listed, but only abnormal results are displayed)  Labs Reviewed  URINALYSIS, COMPLETE (UACMP) WITH MICROSCOPIC - Abnormal; Notable for the following components:      Result Value   Color, Urine YELLOW (*)    APPearance CLEAR (*)    Hgb urine dipstick SMALL (*)    Leukocytes, UA SMALL (*)    All other components within normal limits  URINE CULTURE   ____________________________________________  EKG  None ____________________________________________  RADIOLOGY  ED MD interpretation: No acute fracture or dislocation of  knee; no ICH on CT head  Official radiology report(s): Ct Head Wo Contrast  Result Date: 10/13/2017 CLINICAL DATA:  Fall from bed today. On Eliquis. History of dementia. EXAM: CT HEAD WITHOUT CONTRAST TECHNIQUE: Contiguous axial images were obtained from the base of the skull through the vertex without intravenous contrast. COMPARISON:  MRI of the head May 13, 2015. FINDINGS: BRAIN: No intraparenchymal hemorrhage, mass effect nor midline shift. LEFT mesial temporal occipital lobe encephalomalacia. Ex vacuo dilatation LEFT lateral ventricle. No hydrocephalus. Disproportion of bilateral mesial temporal lobe atrophy progressed from prior examination. Patchy to confluent supratentorial white matter hypodensities. No acute large vascular territory infarcts. No abnormal extra-axial fluid collections. Basal cisterns are patent. VASCULAR: Moderate calcific atherosclerosis of the carotid siphons. SKULL: No skull fracture. Partially sella. No significant scalp soft tissue swelling. SINUSES/ORBITS: The mastoid air-cells and included paranasal sinuses are well-aerated.The included ocular globes and orbital contents are non-suspicious. Status post RIGHT  ocular lens implant. OTHER: None. IMPRESSION: 1. No acute intracranial process. 2. 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 3. Moderate to severe chronic small vessel ischemic changes. 4. Advanced mesial temporal lobe volume loss associated with neuro degenerative syndromes. Electronically Signed   By: Awilda Metroourtnay  Bloomer M.D.   On: 10/13/2017 03:53   Dg Knee Complete 4 Views Right  Result Date: 10/13/2017 CLINICAL DATA:  Right knee pain after rolling out of bed this morning. EXAM: RIGHT KNEE - COMPLETE 4+ VIEW COMPARISON:  None. FINDINGS: No evidence of fracture, dislocation, or joint effusion. Moderate tricompartmental osteoarthritis with peripheral spurring and medial tibiofemoral joint space narrowing. Probable intra-articular bodies posteriorly in the joint. No focal soft  tissue abnormality. IMPRESSION: 1. No acute fracture of the right knee. 2. Moderate osteoarthritis with probable intra-articular bodies. Electronically Signed   By: Rubye OaksMelanie  Ehinger M.D.   On: 10/13/2017 02:14    ____________________________________________   PROCEDURES  Procedure(s) performed: None  Procedures  Critical Care performed: No  ____________________________________________   INITIAL IMPRESSION / ASSESSMENT AND PLAN / ED COURSE  As part of my medical decision making, I reviewed the following data within the electronic MEDICAL RECORD NUMBER Nursing notes reviewed and incorporated, Labs reviewed, Old chart reviewed, Radiograph reviewed  and Notes from prior ED visits   83 year old female sent from memory care unit status post fall from bed with right knee pain.  Will obtain plain film x-rays, urinalysis.  Will obtain CT head as patient does not know if she struck her head or suffered LOC.  Will reassess.   Clinical Course as of Oct 13 401  Thu Oct 13, 2017  0402 Updated patient of negative CT head.  [facility].  Strict return precautions given.  Patient verbalizes understanding agrees with plan of care.   [JS]    Clinical Course User Index [JS] Irean HongSung, Jade J, MD     ____________________________________________   FINAL CLINICAL IMPRESSION(S) / ED DIAGNOSES  Final diagnoses:  Fall, initial encounter  Acute pain of right knee  Urinary tract infection without hematuria, site unspecified     ED Discharge Orders    None       Note:  This document was prepared using Dragon voice recognition software and may include unintentional dictation errors.    Irean HongSung, Jade J, MD 10/13/17 [phone]

## 2017-10-13 NOTE — ED Notes (Signed)
Pt assisted up to commode to void.  

## 2017-10-14 LAB — URINE CULTURE

## 2017-11-18 ENCOUNTER — Inpatient Hospital Stay
Admission: EM | Admit: 2017-11-18 | Discharge: 2017-11-22 | DRG: 871 | Disposition: A | Discharge status: Skilled Nursing Facility | Payer: Medicare Other | Source: Skilled Nursing Facility | Attending: Internal Medicine | Admitting: Internal Medicine

## 2017-11-18 ENCOUNTER — Encounter: Payer: Self-pay | Admitting: Emergency Medicine

## 2017-11-18 ENCOUNTER — Emergency Department: Payer: Medicare Other

## 2017-11-18 ENCOUNTER — Other Ambulatory Visit: Payer: Self-pay

## 2017-11-18 DIAGNOSIS — Z7901 Long term (current) use of anticoagulants: Secondary | ICD-10-CM | POA: Diagnosis not present

## 2017-11-18 DIAGNOSIS — I10 Essential (primary) hypertension: Secondary | ICD-10-CM | POA: Diagnosis present

## 2017-11-18 DIAGNOSIS — J9601 Acute respiratory failure with hypoxia: Secondary | ICD-10-CM

## 2017-11-18 DIAGNOSIS — Y95 Nosocomial condition: Secondary | ICD-10-CM | POA: Diagnosis present

## 2017-11-18 DIAGNOSIS — Z820 Family history of epilepsy and other diseases of the nervous system: Secondary | ICD-10-CM | POA: Diagnosis not present

## 2017-11-18 DIAGNOSIS — J209 Acute bronchitis, unspecified: Secondary | ICD-10-CM | POA: Diagnosis present

## 2017-11-18 DIAGNOSIS — A419 Sepsis, unspecified organism: Secondary | ICD-10-CM | POA: Diagnosis present

## 2017-11-18 DIAGNOSIS — J180 Bronchopneumonia, unspecified organism: Secondary | ICD-10-CM | POA: Diagnosis present

## 2017-11-18 DIAGNOSIS — I482 Chronic atrial fibrillation: Secondary | ICD-10-CM | POA: Diagnosis present

## 2017-11-18 DIAGNOSIS — J189 Pneumonia, unspecified organism: Secondary | ICD-10-CM

## 2017-11-18 DIAGNOSIS — F0390 Unspecified dementia without behavioral disturbance: Secondary | ICD-10-CM | POA: Diagnosis present

## 2017-11-18 LAB — COMPREHENSIVE METABOLIC PANEL
ALK PHOS: 55 U/L (ref 38–126)
ALT: 22 U/L (ref 0–44)
AST: 35 U/L (ref 15–41)
Albumin: 4 g/dL (ref 3.5–5.0)
Anion gap: 9 (ref 5–15)
BILIRUBIN TOTAL: 1.8 mg/dL — AB (ref 0.3–1.2)
BUN: 17 mg/dL (ref 8–23)
CALCIUM: 9.2 mg/dL (ref 8.9–10.3)
CO2: 28 mmol/L (ref 22–32)
CREATININE: 0.63 mg/dL (ref 0.44–1.00)
Chloride: 100 mmol/L (ref 98–111)
GFR calc Af Amer: >60 mL/min (ref >60)
GFR calc non Af Amer: >60 mL/min (ref >60)
Glucose, Bld: 125 mg/dL — ABNORMAL HIGH (ref 70–99)
Potassium: 4.2 mmol/L (ref 3.5–5.1)
SODIUM: 137 mmol/L (ref 135–145)
Total Protein: 7.7 g/dL (ref 6.5–8.1)

## 2017-11-18 LAB — URINALYSIS, COMPLETE (UACMP) WITH MICROSCOPIC
Bacteria, UA: NONE SEEN
Bilirubin Urine: NEGATIVE
GLUCOSE, UA: NEGATIVE mg/dL
Ketones, ur: 80 mg/dL — AB
Nitrite: NEGATIVE
PROTEIN: 30 mg/dL — AB
Specific Gravity, Urine: 1.025 (ref 1.005–1.030)
pH: 5 (ref 5.0–8.0)

## 2017-11-18 LAB — CBC WITH DIFFERENTIAL/PLATELET
Basophils Absolute: 0 10*3/uL (ref 0–0.1)
Basophils Relative: 0 %
EOS ABS: 0 10*3/uL (ref 0–0.7)
EOS PCT: 0 %
HCT: 43.4 % (ref 35.0–47.0)
HEMOGLOBIN: 15 g/dL (ref 12.0–16.0)
LYMPHS ABS: 4.3 10*3/uL — AB (ref 1.0–3.6)
Lymphocytes Relative: 28 %
MCH: 32.1 pg (ref 26.0–34.0)
MCHC: 34.7 g/dL (ref 32.0–36.0)
MCV: 92.7 fL (ref 80.0–100.0)
Monocytes Absolute: 1.4 10*3/uL — ABNORMAL HIGH (ref 0.2–0.9)
Monocytes Relative: 9 %
NEUTROS PCT: 63 %
Neutro Abs: 9.8 10*3/uL — ABNORMAL HIGH (ref 1.4–6.5)
Platelets: 144 10*3/uL — ABNORMAL LOW (ref 150–440)
RBC: 4.68 MIL/uL (ref 3.80–5.20)
RDW: 13 % (ref 11.5–14.5)
WBC: 15.6 10*3/uL — ABNORMAL HIGH (ref 3.6–11.0)

## 2017-11-18 LAB — PROTIME-INR
INR: 1.21
Prothrombin Time: 15.2 seconds (ref 11.4–15.2)

## 2017-11-18 LAB — LACTIC ACID, PLASMA: Lactic Acid, Venous: 1.5 mmol/L (ref 0.5–1.9)

## 2017-11-18 MED ORDER — IPRATROPIUM-ALBUTEROL 0.5-2.5 (3) MG/3ML IN SOLN
3.0000 mL | Freq: Once | RESPIRATORY_TRACT | Status: AC
  Administered 2017-11-18: 3 mL via RESPIRATORY_TRACT
  Filled 2017-11-18: qty 3

## 2017-11-18 MED ORDER — SODIUM CHLORIDE 0.9 % IV BOLUS
1000.0000 mL | Freq: Once | INTRAVENOUS | Status: AC
  Administered 2017-11-18: 1000 mL via INTRAVENOUS

## 2017-11-18 MED ORDER — CEFEPIME HCL 2 G IJ SOLR
2.0000 g | Freq: Once | INTRAMUSCULAR | Status: AC
Start: 2017-11-19 — End: 2017-11-19
  Administered 2017-11-19: 2 g via INTRAVENOUS
  Filled 2017-11-18: qty 2

## 2017-11-18 MED ORDER — DOXYCYCLINE HYCLATE 100 MG PO TABS
100.0000 mg | ORAL_TABLET | Freq: Once | ORAL | Status: AC
  Administered 2017-11-18: 100 mg via ORAL
  Filled 2017-11-18: qty 1

## 2017-11-18 MED ORDER — SODIUM CHLORIDE 0.9 % IV SOLN
2.0000 g | Freq: Once | INTRAVENOUS | Status: AC
  Administered 2017-11-18: 2 g via INTRAVENOUS
  Filled 2017-11-18: qty 20

## 2017-11-18 MED ORDER — VANCOMYCIN HCL IN DEXTROSE 1-5 GM/200ML-% IV SOLN
1000.0000 mg | Freq: Once | INTRAVENOUS | Status: AC
  Administered 2017-11-19: 1000 mg via INTRAVENOUS
  Filled 2017-11-18: qty 200

## 2017-11-18 NOTE — ED Notes (Signed)
Patient transported to X-ray 

## 2017-11-18 NOTE — ED Provider Notes (Signed)
[facility] Emergency Department Provider Note  ____________________________________________  Time seen: Approximately 9:13 PM  I have reviewed the triage vital signs and the nursing notes.   HISTORY  Chief Complaint Shortness of Breath and Cough  Level 5 caveat:  Portions of the history and physical were unable to be obtained due to dementia   HPI Dominique Patel is a 83 y.o. female with a history of dementia, hypertension, atrial fibrillation on Eliquis who presents for evaluation of shortness of breath and fever.  Patient came via EMS from her assisted living facility.  According to the facility patient has had a productive cough, fatigue, shortness of breath for 2 days.  When EMS was called patient was found to be satting 80% on room air which improved after 2 breathing treatments.  She had a temp of 100.43F.  Patient is complaining of shortness of breath.  She denies chest pain, nausea, vomiting, abdominal pain, diarrhea, dysuria.  Past Medical History:  Diagnosis Date  . Anxiety disorder   . Cataracts, bilateral   . Dementia   . Hypertension   . Major depressive disorder     Patient Active Problem List   Diagnosis Date Noted  . HCAP (healthcare-associated pneumonia) 11/27/2015  . HTN (hypertension) 11/27/2015  . New onset atrial fibrillation (HCC) 11/27/2015  . Major depressive disorder 08/20/2015  . Dementia with psychosis 08/20/2015    Past Surgical History:  Procedure Laterality Date  . ABDOMINAL HYSTERECTOMY      Prior to Admission medications   Medication Sig Start Date End Date Taking? Authorizing Provider  acetaminophen (TYLENOL) 650 MG CR tablet Take 650 mg by mouth 2 (two) times daily.    [provider]  albuterol (PROVENTIL HFA;VENTOLIN HFA) 108 (90 Base) MCG/ACT inhaler Inhale 2 puffs into the lungs every 4 (four) hours as needed for wheezing or shortness of breath (or cough). 08/12/17   Rockne MenghiniNorman, Anne-Caroline, MD    apixaban (ELIQUIS) 5 MG TABS tablet Take 1 tablet (5 mg total) by mouth 2 (two) times daily. 11/29/15   Enid BaasKalisetti, Radhika, MD  atenolol (TENORMIN) 50 MG tablet Take 1 tablet (50 mg total) by mouth daily. 11/29/15   Enid BaasKalisetti, Radhika, MD  benzonatate (TESSALON PERLES) 100 MG capsule Take 1 capsule (100 mg total) by mouth every 6 (six) hours as needed for cough. 08/12/17   Rockne MenghiniNorman, Anne-Caroline, MD  cholecalciferol (VITAMIN D) 1000 units tablet Take 2,000 Units by mouth daily.    [provider]  donepezil (ARICEPT) 10 MG tablet Take 1 tablet by mouth at bedtime.  07/06/15   [provider]  QUEtiapine (SEROQUEL) 25 MG tablet Take 12.5 mg by mouth 2 (two) times daily.     [provider]  sertraline (ZOLOFT) 25 MG tablet Take 1 tablet by mouth every other day.     [provider]  Spacer/Aero-Holding Chambers DEVI 1 puff by Does not apply route every 4 (four) hours as needed. 08/12/17   Rockne MenghiniNorman, Anne-Caroline, MD  traMADol (ULTRAM) 50 MG tablet Take 25 mg by mouth daily. And 25MG  by mouth every 8 hours as needed for pain    [provider]  vitamin B-12 (CYANOCOBALAMIN) 250 MCG tablet Take 1,000 mcg by mouth daily.     [provider]    Allergies Patient has no known allergies.  Family History  Problem Relation Age of Onset  . Alzheimer's disease Brother     Social History Social History   Tobacco Use  . Smoking  status: Never Smoker  . Smokeless tobacco: Never Used  Substance Use Topics  . Alcohol use: No  . Drug use: No    Review of Systems  Constitutional: + fever and fatigue Eyes: Negative for visual changes. ENT: Negative for sore throat. Neck: No neck pain  Cardiovascular: Negative for chest pain. Respiratory: + shortness of breath and cough Gastrointestinal: Negative for abdominal pain, vomiting or diarrhea. Genitourinary: Negative for dysuria. Musculoskeletal: Negative for back pain. Skin: Negative for  rash. Neurological: Negative for headaches, weakness or numbness. Psych: No SI or HI  ____________________________________________   PHYSICAL EXAM:  VITAL SIGNS: ED Triage Vitals  Enc Vitals Group     BP 11/18/17 2057 (!) 151/88     Pulse Rate 11/18/17 2057 100     Resp 11/18/17 2057 (!) 28     Temp 11/18/17 2057 98.5 F (36.9 C)     Temp Source 11/18/17 2057 Oral     SpO2 11/18/17 2057 95 %     Weight 11/18/17 2058 191 lb 6.4 oz (86.8 kg)     Height 11/18/17 2058 5\' 5"  (1.651 m)     Head Circumference --      Peak Flow --      Pain Score 11/18/17 2058 0     Pain Loc --      Pain Edu? --      Excl. in GC? --     Constitutional: Alert and oriented x2, actively coughing HEENT:      Head: Normocephalic and atraumatic.         Eyes: Conjunctivae are normal. Sclera is non-icteric.       Mouth/Throat: Mucous membranes are moist.       Neck: Supple with no signs of meningismus. Cardiovascular: Tachycardic rate with irregular rhythm. No murmurs, gallops, or rubs. 2+ symmetrical distal pulses are present in all extremities. No JVD. Respiratory: Increased work of breathing, tachypneic, satting well on room air, coarse rhonchi bilaterally with faint expiratory wheezes throughout  Gastrointestinal: Soft, non tender, and non distended with positive bowel sounds. No rebound or guarding. Genitourinary: No CVA tenderness. Musculoskeletal: Nontender with normal range of motion in all extremities. No edema, cyanosis, or erythema of extremities. Neurologic: Normal speech and language. Face is symmetric. Moving all extremities. No gross focal neurologic deficits are appreciated. Skin: Skin is warm, dry and intact. No rash noted. Psychiatric: Mood and affect are normal. Speech and behavior are normal.  ____________________________________________   LABS (all labs ordered are listed, but only abnormal results are displayed)  Labs Reviewed  COMPREHENSIVE METABOLIC PANEL - Abnormal; Notable  for the following components:      Result Value   Glucose, Bld 125 (*)    Total Bilirubin 1.8 (*)    All other components within normal limits  CBC WITH DIFFERENTIAL/PLATELET - Abnormal; Notable for the following components:   WBC 15.6 (*)    Platelets 144 (*)    Neutro Abs 9.8 (*)    Lymphs Abs 4.3 (*)    Monocytes Absolute 1.4 (*)    All other components within normal limits  CULTURE, BLOOD (ROUTINE X 2)  CULTURE, BLOOD (ROUTINE X 2)  URINE CULTURE  LACTIC ACID, PLASMA  PROTIME-INR  LACTIC ACID, PLASMA  URINALYSIS, COMPLETE (UACMP) WITH MICROSCOPIC  URINALYSIS, ROUTINE W REFLEX MICROSCOPIC   ____________________________________________  EKG  ED ECG REPORT I, Nita Sicklearolina Derry Arbogast, the attending physician, personally viewed and interpreted this ECG.   Atrial fibrillation, rate of 95, normal QTC, normal axis, no  ST elevations or depressions.  Unchanged from prior. ____________________________________________  RADIOLOGY  I have personally reviewed the images performed during this visit and I agree with the Radiologist's read.   Interpretation by Radiologist:  Dg Chest 2 View  Result Date: 11/18/2017 CLINICAL DATA:  83 y/o F; productive cough, shortness of breath, and fatigue over the last 2 days. EXAM: CHEST - 2 VIEW COMPARISON:  09/12/2017 chest radiograph FINDINGS: Stable cardiomegaly given projection and technique. Aortic atherosclerosis with calcification. Increase bronchitic changes and patchy opacities of the lungs. No pleural effusion or pneumothorax. No acute osseous abnormality is evident. IMPRESSION: 1. Increased bronchitic changes and patchy opacities of the lungs probably representing acute bronchitis/early bronchopneumonia. 2. Aortic atherosclerosis. 3. Cardiomegaly. Electronically Signed   By: Mitzi Hansen M.D.   On: 11/18/2017 22:26      ____________________________________________   PROCEDURES  Procedure(s) performed: None Procedures Critical  Care performed: yes  CRITICAL CARE Performed by: Nita Sickle  ?  Total critical care time: 35 min  Critical care time was exclusive of separately billable procedures and treating other patients.  Critical care was necessary to treat or prevent imminent or life-threatening deterioration.  Critical care was time spent personally by me on the following activities: development of treatment plan with patient and/or surrogate as well as nursing, discussions with consultants, evaluation of patient's response to treatment, examination of patient, obtaining history from patient or surrogate, ordering and performing treatments and interventions, ordering and review of laboratory studies, ordering and review of radiographic studies, pulse oximetry and re-evaluation of patient's condition.  ____________________________________________   INITIAL IMPRESSION / ASSESSMENT AND PLAN / ED COURSE  83 y.o. female with a history of dementia, hypertension, atrial fibrillation on Eliquis who presents for evaluation of shortness of breath and fever.  Upon arrival to the emergency room patient meets sepsis criteria with tachycardia, tachypnea, and fever documented by EMS of 100.77F.  Patient has a very productive cough with coarse rhonchi and wheezes bilaterally concerning for pneumonia.  Will check urine to rule out UTI as well.  Labs and blood cultures have been sent.  Will give duoneb, rocephin and doxy for CA-PNA. Anticipate admission    _________________________ 10:34 PM on 11/18/2017 -----------------------------------------  Labs show leukocytosis with white count of 15.6, normal lactic acid.  Chest x-ray concerning for pneumonia.  Patient has received Rocephin and doxycycline.  Will admit to the hospitalist service.   As part of my medical decision making, I reviewed the following data within the electronic MEDICAL RECORD NUMBER Nursing notes reviewed and incorporated, Labs reviewed , EKG interpreted ,  Old chart reviewed, Radiograph reviewed , Discussed with admitting physician , Notes from prior ED visits and [place] Controlled Substance Database    Pertinent labs & imaging results that were available during my care of the patient were reviewed by me and considered in my medical decision making (see chart for details).    ____________________________________________   FINAL CLINICAL IMPRESSION(S) / ED DIAGNOSES  Final diagnoses:  Sepsis, due to unspecified organism [facility])  Community acquired pneumonia, unspecified laterality  Acute respiratory failure with hypoxia (HCC)      NEW MEDICATIONS STARTED DURING THIS VISIT:  ED Discharge Orders    None       Note:  This document was prepared using Dragon voice recognition software and may include unintentional dictation errors.    Nita Sickle, MD 11/18/17 2234

## 2017-11-18 NOTE — ED Triage Notes (Signed)
[facility] with [facility]/o productive cough, SOB and fatigue over last 2 days. Upper 80% saturation for EMS. Bilateral wheezes. 2 breathing treatments given with EMS. 167 Blood Sugar, 100.4 temp w/ EMS. Rash noted to groin area. NSR on monitor.  Hx of dementia, depression and HTN.

## 2017-11-18 NOTE — H&P (Signed)
[facility] [place] [facility]   PATIENT NAME: Dominique HeadsSally Patel    MR#:  [id number]  DATE OF BIRTH:  [birth year]  DATE OF ADMISSION:  11/18/2017  PRIMARY CARE PHYSICIAN: System, Pcp Not In   REQUESTING/REFERRING PHYSICIAN:   CHIEF COMPLAINT:   Chief Complaint  Patient presents with  . Shortness of Breath  . Cough    HISTORY OF PRESENT ILLNESS: Dominique Patel  is a 83 y.o. female, assisted living resident, with a known history of dementia, hypertension, depression disorder, anxiety disorder.  She also has history of chronic atrial fibrillation for which she is on Eliquis. Patient is unable to provide history due to dementia.  Information was taken from reviewing the medical chart and from discussion with emergency room physician. According to the facility, patient has had productive cough, fatigue and shortness of breath going on for the past 2 to 3 days.  She was started on a azithromycin p.o. 2 days ago, by the rounding physician at the assisted living facility.  No improvement noted. When EMS was called, patient was found with tachycardia, tachypnea and hypoxia.  Oxygen saturation was 80% on room air.  Temperature was 100.4. No reports of chest pain, nausea, vomiting, abdominal pain, diarrhea, bleeding. Blood test done emergency room are remarkable for elevated WBC at 15,000.  Troponin level is lower than 0.03.  UA is negative for bacteria. Chest x-ray shows increased bronchitic changes and patchy opacities of the lungs probably representing acute bronchitis/early bronchopneumonia. Patient is admitted for further evaluation and treatment.  PAST MEDICAL HISTORY:   Past Medical History:  Diagnosis Date  . Anxiety disorder   . Cataracts, bilateral   . Dementia   . Hypertension   . Major depressive disorder     PAST SURGICAL HISTORY:  Past Surgical History:  Procedure Laterality Date  . ABDOMINAL HYSTERECTOMY      SOCIAL HISTORY:  Social History    Tobacco Use  . Smoking status: Never Smoker  . Smokeless tobacco: Never Used  Substance Use Topics  . Alcohol use: No    FAMILY HISTORY:  Family History  Problem Relation Age of Onset  . Alzheimer's disease Brother     DRUG ALLERGIES: No Known Allergies  REVIEW OF SYSTEMS:   Unable to obtain, due to patient's poor memory, secondary to dementia.  MEDICATIONS AT HOME:  Prior to Admission medications   Medication Sig Start Date End Date Taking? Authorizing Provider  acetaminophen (TYLENOL) 650 MG CR tablet Take 650 mg by mouth 2 (two) times daily.   Yes [provider]  albuterol (PROVENTIL HFA;VENTOLIN HFA) 108 (90 Base) MCG/ACT inhaler Inhale 2 puffs into the lungs every 4 (four) hours as needed for wheezing or shortness of breath (or cough). 08/12/17  Yes Rockne MenghiniNorman, Anne-Caroline, MD  apixaban (ELIQUIS) 5 MG TABS tablet Take 1 tablet (5 mg total) by mouth 2 (two) times daily. 11/29/15  Yes Enid BaasKalisetti, Radhika, MD  atenolol (TENORMIN) 50 MG tablet Take 1 tablet (50 mg total) by mouth daily. 11/29/15  Yes Enid BaasKalisetti, Radhika, MD  azithromycin (ZITHROMAX) 250 MG tablet Take by mouth daily. Take 2 tablets by mouth on day 1, then take 1 tablet once daily for 4 days 11/14/17  Yes [provider]  benzonatate (TESSALON PERLES) 100 MG capsule Take 1 capsule (100 mg total) by mouth every 6 (six) hours as needed for cough. 08/12/17  Yes Rockne MenghiniNorman, Anne-Caroline, MD  cholecalciferol (VITAMIN D) 1000 units tablet Take 2,000 Units by mouth daily.  Yes [provider]  donepezil (ARICEPT) 10 MG tablet Take 1 tablet by mouth at bedtime.  07/06/15  Yes [provider]  guaiFENesin (MUCINEX) 600 MG 12 hr tablet Take 600 mg by mouth 2 (two) times daily. For 5 days 11/14/17  Yes [provider]  QUEtiapine (SEROQUEL) 25 MG tablet Take 12.5 mg by mouth 2 (two) times daily.    Yes [provider]  sertraline (ZOLOFT) 25 MG tablet Take 1 tablet by mouth every  other day.    Yes [provider]  traMADol (ULTRAM) 50 MG tablet Take 25 mg by mouth daily. And 25MG  by mouth every 8 hours as needed for pain   Yes [provider]  trimethoprim-polymyxin b (POLYTRIM) ophthalmic solution Place 1 drop into both eyes every 6 (six) hours. For 5 days 11/17/17  Yes [provider]  vitamin B-12 (CYANOCOBALAMIN) 250 MCG tablet Take 250 mcg by mouth daily.    Yes [provider]  Spacer/Aero-Holding Chambers DEVI 1 puff by Does not apply route every 4 (four) hours as needed. 08/12/17   Rockne MenghiniNorman, Anne-Caroline, MD      PHYSICAL EXAMINATION:   VITAL SIGNS: Blood pressure (!) 152/81, pulse 95, temperature 98.5 F (36.9 C), temperature source Oral, resp. rate (!) 22, height 5\' 5"  (1.651 m), weight 86.8 kg, SpO2 93 %.  GENERAL:  83 y.o.-year-old patient lying in the bed with no acute distress.  She looks acutely ill but nontoxic. EYES: Pupils equal, round, reactive to light and accommodation. No scleral icterus. Extraocular muscles intact.  HEENT: Head atraumatic, normocephalic. Oropharynx and nasopharynx clear.  NECK:  Supple, no jugular venous distention. No thyroid enlargement, no tenderness.  LUNGS: Reduced breath sounds and scattered wheezing noted bilaterally. No use of accessory muscles of respiration.  CARDIOVASCULAR: Irregularly irregular rhythm.  S1, S2 present. No S3/S4.  ABDOMEN: Soft, nontender, nondistended. Bowel sounds present. No organomegaly or mass.  EXTREMITIES: No pedal edema, cyanosis, or clubbing.  NEUROLOGIC: Cranial nerves II through XII are intact. Muscle strength 5/5 in all extremities. Sensation intact.   PSYCHIATRIC: The patient is alert, but pleasantly confused.  SKIN: No ulcers.   LABORATORY PANEL:   CBC Recent Labs  Lab 11/18/17 2101  WBC 15.6*  HGB 15.0  HCT 43.4  PLT 144*  MCV 92.7  MCH 32.1  MCHC 34.7  RDW 13.0  LYMPHSABS 4.3*  MONOABS 1.4*  EOSABS 0.0  BASOSABS 0.0    ------------------------------------------------------------------------------------------------------------------  Chemistries  Recent Labs  Lab 11/18/17 2101  NA 137  K 4.2  CL 100  CO2 28  GLUCOSE 125*  BUN 17  CREATININE 0.63  CALCIUM 9.2  AST 35  ALT 22  ALKPHOS 55  BILITOT 1.8*   ------------------------------------------------------------------------------------------------------------------ estimated creatinine clearance is 60 mL/min (by C-G formula based on SCr of 0.63 mg/dL). ------------------------------------------------------------------------------------------------------------------ No results for input(s): TSH, T4TOTAL, T3FREE, THYROIDAB in the last 72 hours.  Invalid input(s): FREET3   Coagulation profile Recent Labs  Lab 11/18/17 2150  INR 1.21   ------------------------------------------------------------------------------------------------------------------- No results for input(s): DDIMER in the last 72 hours. -------------------------------------------------------------------------------------------------------------------  Cardiac Enzymes No results for input(s): CKMB, TROPONINI, MYOGLOBIN in the last 168 hours.  Invalid input(s): CK ------------------------------------------------------------------------------------------------------------------ Invalid input(s): POCBNP  ---------------------------------------------------------------------------------------------------------------  Urinalysis    Component Value Date/Time   COLORURINE YELLOW (A) 11/18/2017 2102   APPEARANCEUR CLEAR (A) 11/18/2017 2102   APPEARANCEUR CLEAR 05/13/2012 1441   LABSPEC 1.025 11/18/2017 2102   LABSPEC 1.015 05/13/2012 1441   PHURINE 5.0 11/18/2017 2102  GLUCOSEU NEGATIVE 11/18/2017 2102   GLUCOSEU NEGATIVE 05/13/2012 1441   HGBUR SMALL (A) 11/18/2017 2102   BILIRUBINUR NEGATIVE 11/18/2017 2102   BILIRUBINUR NEGATIVE 05/13/2012 1441   KETONESUR 80 (A)  11/18/2017 2102   PROTEINUR 30 (A) 11/18/2017 2102   NITRITE NEGATIVE 11/18/2017 2102   LEUKOCYTESUR SMALL (A) 11/18/2017 2102   LEUKOCYTESUR NEGATIVE 05/13/2012 1441     RADIOLOGY: Dg Chest 2 View  Result Date: 11/18/2017 CLINICAL DATA:  83 y/o F; productive cough, shortness of breath, and fatigue over the last 2 days. EXAM: CHEST - 2 VIEW COMPARISON:  09/12/2017 chest radiograph FINDINGS: Stable cardiomegaly given projection and technique. Aortic atherosclerosis with calcification. Increase bronchitic changes and patchy opacities of the lungs. No pleural effusion or pneumothorax. No acute osseous abnormality is evident. IMPRESSION: 1. Increased bronchitic changes and patchy opacities of the lungs probably representing acute bronchitis/early bronchopneumonia. 2. Aortic atherosclerosis. 3. Cardiomegaly. Electronically Signed   By: Mitzi Hansen M.D.   On: 11/18/2017 22:26    EKG: Orders placed or performed during the hospital encounter of 11/18/17  . ED EKG 12-Lead  . ED EKG 12-Lead  . EKG 12-Lead  . EKG 12-Lead    IMPRESSION AND PLAN:  1.  Sepsis, likely secondary to pneumonia.  We will start treatment with IV fluids and antibiotics, vancomycin and cefepime IV.  Continue nebulizer treatments and oxygen therapy as needed. 2.  Facility acquired pneumonia, see treatment as above under #1. 3.  Chronic atrial fibrillation, rate controlled.  Continue anticoagulation with Eliquis to prevent stroke. 4.  Hypertension, stable, continue home medications.  All the records are reviewed and case discussed with ED provider. Management plans discussed with the patient, family and they are in agreement.  CODE STATUS: Full Code Status History    Date Active Date Inactive Code Status Order ID Comments User Context   11/27/2015 2202 11/29/2015 1557 Full Code [id number]  Oralia Manis, MD Inpatient       TOTAL TIME TAKING CARE OF THIS PATIENT: 50 minutes.    Cammy Copa M.D on  11/18/2017 at 11:47 PM  Between 7am to 6pm - Pager - [phone]  After 6pm go to [email or web address] - password [email or web address] [place] at [facility]  [phone]  CC: Primary care physician; System, Pcp Not In

## 2017-11-18 NOTE — Progress Notes (Signed)
CODE SEPSIS - PHARMACY COMMUNICATION  **Broad Spectrum Antibiotics should be administered within 1 hour of Sepsis diagnosis**  Time Code Sepsis Called/Page Received: @ 2123  Antibiotics Ordered: Ceftriaxone                                      Doxycycline   Time of 1st antibiotic administration: @ 2157  Additional action taken by pharmacy: NA  If necessary, Name of Provider/Nurse Contacted: NA  Gardner CandleSheema M Xxavier Noon, PharmD, BCPS Clinical Pharmacist 11/18/2017 10:03 PM

## 2017-11-19 LAB — BASIC METABOLIC PANEL
Anion gap: 9 (ref 5–15)
BUN: 15 mg/dL (ref 8–23)
CHLORIDE: 104 mmol/L (ref 98–111)
CO2: 26 mmol/L (ref 22–32)
Calcium: 8.5 mg/dL — ABNORMAL LOW (ref 8.9–10.3)
Creatinine, Ser: 0.71 mg/dL (ref 0.44–1.00)
GFR calc Af Amer: >60 mL/min (ref >60)
GFR calc non Af Amer: >60 mL/min (ref >60)
Glucose, Bld: 134 mg/dL — ABNORMAL HIGH (ref 70–99)
POTASSIUM: 3.8 mmol/L (ref 3.5–5.1)
Sodium: 139 mmol/L (ref 135–145)

## 2017-11-19 LAB — CBC
HEMATOCRIT: 41.6 % (ref 35.0–47.0)
Hemoglobin: 14.2 g/dL (ref 12.0–16.0)
MCH: 31.7 pg (ref 26.0–34.0)
MCHC: 34 g/dL (ref 32.0–36.0)
MCV: 93 fL (ref 80.0–100.0)
Platelets: 135 10*3/uL — ABNORMAL LOW (ref 150–440)
RBC: 4.47 MIL/uL (ref 3.80–5.20)
RDW: 12.9 % (ref 11.5–14.5)
WBC: 14.1 10*3/uL — ABNORMAL HIGH (ref 3.6–11.0)

## 2017-11-19 LAB — MRSA PCR SCREENING: MRSA by PCR: NEGATIVE

## 2017-11-19 LAB — LACTIC ACID, PLASMA: Lactic Acid, Venous: 1.9 mmol/L (ref 0.5–1.9)

## 2017-11-19 MED ORDER — ACETAMINOPHEN 325 MG PO TABS: 650.0000 mg | ORAL_TABLET | Freq: Four times a day (QID) | ORAL | Status: DC | PRN

## 2017-11-19 MED ORDER — ACETAMINOPHEN 650 MG RE SUPP: 650.0000 mg | Freq: Four times a day (QID) | RECTAL | Status: DC | PRN

## 2017-11-19 MED ORDER — ONDANSETRON HCL 4 MG PO TABS: 4.0000 mg | ORAL_TABLET | Freq: Four times a day (QID) | ORAL | Status: DC | PRN

## 2017-11-19 MED ORDER — BISACODYL 5 MG PO TBEC: 5.0000 mg | DELAYED_RELEASE_TABLET | Freq: Every day | ORAL | Status: DC | PRN

## 2017-11-19 MED ORDER — SODIUM CHLORIDE 0.9 % IV SOLN
INTRAVENOUS | Status: DC
  Administered 2017-11-19: 03:00:00 via INTRAVENOUS

## 2017-11-19 MED ORDER — VANCOMYCIN HCL IN DEXTROSE 1-5 GM/200ML-% IV SOLN
1000.0000 mg | Freq: Two times a day (BID) | INTRAVENOUS | Status: DC
  Administered 2017-11-19: 1000 mg via INTRAVENOUS
  Filled 2017-11-19 (×2): qty 200

## 2017-11-19 MED ORDER — HYDROCODONE-ACETAMINOPHEN 5-325 MG PO TABS: 1.0000 | ORAL_TABLET | ORAL | Status: DC | PRN

## 2017-11-19 MED ORDER — ONDANSETRON HCL 4 MG/2ML IJ SOLN: 4.0000 mg | Freq: Four times a day (QID) | INTRAMUSCULAR | Status: DC | PRN

## 2017-11-19 MED ORDER — CARVEDILOL 3.125 MG PO TABS
3.1250 mg | ORAL_TABLET | Freq: Once | ORAL | Status: AC
  Administered 2017-11-19: 03:00:00 3.125 mg via ORAL
  Filled 2017-11-19: qty 1

## 2017-11-19 MED ORDER — BENZONATATE 100 MG PO CAPS: 100.0000 mg | ORAL_CAPSULE | Freq: Four times a day (QID) | ORAL | Status: DC | PRN

## 2017-11-19 MED ORDER — APIXABAN 5 MG PO TABS
5.0000 mg | ORAL_TABLET | Freq: Two times a day (BID) | ORAL | Status: DC
  Administered 2017-11-19 – 2017-11-22 (×7): 5 mg via ORAL
  Filled 2017-11-19 (×7): qty 1

## 2017-11-19 MED ORDER — ATENOLOL 50 MG PO TABS
50.0000 mg | ORAL_TABLET | Freq: Every day | ORAL | Status: DC
  Administered 2017-11-19 – 2017-11-22 (×4): 50 mg via ORAL
  Filled 2017-11-19 (×4): qty 1

## 2017-11-19 MED ORDER — DOCUSATE SODIUM 100 MG PO CAPS
100.0000 mg | ORAL_CAPSULE | Freq: Two times a day (BID) | ORAL | Status: DC
  Administered 2017-11-19 – 2017-11-21 (×3): 100 mg via ORAL
  Filled 2017-11-19 (×5): qty 1

## 2017-11-19 MED ORDER — TRAZODONE HCL 50 MG PO TABS: 25.0000 mg | ORAL_TABLET | Freq: Every evening | ORAL | Status: DC | PRN

## 2017-11-19 MED ORDER — SODIUM CHLORIDE 0.9 % IV SOLN
2.0000 g | Freq: Two times a day (BID) | INTRAVENOUS | Status: DC
  Administered 2017-11-19 – 2017-11-22 (×7): 2 g via INTRAVENOUS
  Filled 2017-11-19 (×9): qty 2

## 2017-11-19 MED ORDER — QUETIAPINE FUMARATE 25 MG PO TABS
12.5000 mg | ORAL_TABLET | Freq: Two times a day (BID) | ORAL | Status: DC
  Administered 2017-11-19 – 2017-11-22 (×7): 12.5 mg via ORAL
  Filled 2017-11-19 (×7): qty 1

## 2017-11-19 MED ORDER — VITAMIN D 1000 UNITS PO TABS
2000.0000 [IU] | ORAL_TABLET | Freq: Every day | ORAL | Status: DC
  Administered 2017-11-19 – 2017-11-22 (×4): 2000 [IU] via ORAL
  Filled 2017-11-19 (×4): qty 2

## 2017-11-19 MED ORDER — GUAIFENESIN ER 600 MG PO TB12
600.0000 mg | ORAL_TABLET | Freq: Two times a day (BID) | ORAL | Status: DC
  Administered 2017-11-19 – 2017-11-22 (×7): 600 mg via ORAL
  Filled 2017-11-19 (×7): qty 1

## 2017-11-19 MED ORDER — SERTRALINE HCL 50 MG PO TABS
25.0000 mg | ORAL_TABLET | ORAL | Status: DC
  Administered 2017-11-19 – 2017-11-21 (×2): 25 mg via ORAL
  Filled 2017-11-19 (×2): qty 1

## 2017-11-19 MED ORDER — TRAMADOL HCL 50 MG PO TABS
25.0000 mg | ORAL_TABLET | Freq: Every day | ORAL | Status: DC
  Administered 2017-11-19 – 2017-11-22 (×4): 25 mg via ORAL
  Filled 2017-11-19 (×4): qty 1

## 2017-11-19 MED ORDER — CYANOCOBALAMIN 500 MCG PO TABS
250.0000 ug | ORAL_TABLET | Freq: Every day | ORAL | Status: DC
  Administered 2017-11-19 – 2017-11-22 (×4): 250 ug via ORAL
  Filled 2017-11-19 (×4): qty 1

## 2017-11-19 MED ORDER — IPRATROPIUM-ALBUTEROL 0.5-2.5 (3) MG/3ML IN SOLN
3.0000 mL | Freq: Four times a day (QID) | RESPIRATORY_TRACT | Status: DC
Start: 2017-11-19 — End: 2017-11-22
  Administered 2017-11-19 – 2017-11-22 (×12): 3 mL via RESPIRATORY_TRACT
  Filled 2017-11-19 (×12): qty 3

## 2017-11-19 MED ORDER — DONEPEZIL HCL 5 MG PO TABS
10.0000 mg | ORAL_TABLET | Freq: Every day | ORAL | Status: DC
  Administered 2017-11-19 – 2017-11-21 (×3): 10 mg via ORAL
  Filled 2017-11-19 (×4): qty 2

## 2017-11-19 NOTE — [place] FL2 (Addendum)
[place] MEDICAID FL2 LEVEL OF CARE SCREENING TOOL     IDENTIFICATION  Patient Name: Dominique Patel Birthdate: [birth year] Sex: female Admission Date (Current Location): 11/18/2017  [place]   Facility and Address:  [facility], [address], [place], [place] [address]      Provider Number: [id number]  Attending Physician Name and Address:  Altamese DillingVachhani, Vaibhavkumar, *  Relative Name and Phone Number:  Ames CoupeRonald Williams [facility](Nephew) [phone]    Current Level of Care: Hospital Recommended Level of Care: Assisted Living Facility Prior Approval Number:    Date Approved/Denied:   PASRR Number:    Discharge Plan: Domiciliary (Rest home)    Current Diagnoses: Patient Active Problem List   Diagnosis Date Noted  . Sepsis (HCC) 11/18/2017  . HCAP (healthcare-associated pneumonia) 11/27/2015  . HTN (hypertension) 11/27/2015  . New onset atrial fibrillation (HCC) 11/27/2015  . Major depressive disorder 08/20/2015  . Dementia with psychosis 08/20/2015    Orientation RESPIRATION BLADDER Height & Weight     Self, Time, Situation, Place  Normal Continent Weight: 191 lb 6.4 oz (86.8 kg) Height:  5\' 5"  (165.1 cm)  BEHAVIORAL SYMPTOMS/MOOD NEUROLOGICAL BOWEL NUTRITION STATUS      Continent Diet(Heart healthy)  AMBULATORY STATUS COMMUNICATION OF NEEDS Skin   Supervision Verbally Normal                       Personal Care Assistance Level of Assistance  Bathing, Feeding, Dressing Bathing Assistance: Limited assistance Feeding assistance: Independent Dressing Assistance: Limited assistance     Functional Limitations Info  Sight, Hearing, Speech Sight Info: Adequate Hearing Info: Impaired Speech Info: Adequate    SPECIAL CARE FACTORS FREQUENCY                       Contractures Contractures Info: Not present    Additional Factors Info  Code Status, Allergies, Psychotropic Code Status Info:  Full Allergies Info: No Known Allergies Psychotropic Info: Seroquel, Zoloft, Trazodone         TAKE these medications   acetaminophen 650 MG CR tablet Commonly known as:  TYLENOL Take 650 mg by mouth 2 (two) times daily.   albuterol 108 (90 Base) MCG/ACT inhaler Commonly known as:  PROVENTIL HFA;VENTOLIN HFA Inhale 2 puffs into the lungs every 4 (four) hours as needed for wheezing or shortness of breath (or cough).   apixaban 5 MG Tabs tablet Commonly known as:  ELIQUIS Take 1 tablet (5 mg total) by mouth 2 (two) times daily.   atenolol 50 MG tablet Commonly known as:  TENORMIN Take 1 tablet (50 mg total) by mouth daily.   benzonatate 100 MG capsule Commonly known as:  TESSALON Take 1 capsule (100 mg total) by mouth every 6 (six) hours as needed for cough.   cephALEXin 500 MG capsule Commonly known as:  KEFLEX Take 1 capsule (500 mg total) by mouth 2 (two) times daily for 3 days.   cholecalciferol 1000 units tablet Commonly known as:  VITAMIN D Take 2,000 Units by mouth daily.   diltiazem 120 MG 24 hr capsule Commonly known as:  CARDIZEM CD Take 1 capsule (120 mg total) by mouth daily. Start taking on:  11/22/2017   donepezil 10 MG tablet Commonly known as:  ARICEPT Take 1 tablet by mouth at bedtime.   guaiFENesin 600 MG 12 hr tablet Commonly known as:  MUCINEX Take 600 mg by mouth 2 (two) times daily.  For 5 days   lisinopril 10 MG tablet Commonly known as:  PRINIVIL,ZESTRIL Take 1 tablet (10 mg total) by mouth daily. Start taking on:  11/22/2017   predniSONE 10 MG (21) Tbpk tablet Commonly known as:  STERAPRED UNI-PAK 21 TAB Take 6 tabs first day, 5 tab on day 2, then 4 on day 3rd, 3 tabs on day 4th , 2 tab on day 5th, and 1 tab on 6th day.   QUEtiapine 25 MG tablet Commonly known as:  SEROQUEL Take 12.5 mg by mouth 2 (two) times daily.   sertraline 25 MG tablet Commonly known as:  ZOLOFT Take 1 tablet by mouth every other day.    Spacer/Aero-Holding Rudean Curthambers Devi 1 puff by Does not apply route every 4 (four) hours as needed.   traMADol 50 MG tablet Commonly known as:  ULTRAM Take 25 mg by mouth daily. And 25MG  by mouth every 8 hours as needed for pain   trimethoprim-polymyxin b ophthalmic solution Commonly known as:  POLYTRIM Place 1 drop into both eyes every 6 (six) hours. For 5 days   vitamin B-12 250 MCG tablet Commonly known as:  CYANOCOBALAMIN Take 250 mcg by mouth daily.      Discharge Medications: Please see discharge summary for a list of discharge medications.  Relevant Imaging Results:  Relevant Lab Results:   Additional Information SS#[id number]  Judi CongKaren M White, LCSW

## 2017-11-19 NOTE — Progress Notes (Signed)
Pharmacy Antibiotic Note  Dominique Patel is a 83 y.o. female admitted on 11/18/2017 with pneumonia.  Pharmacy has been consulted for vanc/cefepime dosing.  Plan: Will continue vanc 1g IV q12h w/ 6 hour stack  Will draw vanc trough 08/18 @ 1700 prior to 4th dose. Will continue cefepime 2g IV q12h  Ke 0.0542 T1/2 12 hrs Goal trough 15 - 20 mcg/mL  Height: 5\' 5"  (165.1 cm) Weight: 191 lb 6.4 oz (86.8 kg) IBW/kg (Calculated) : 57  Temp (24hrs), Avg:98.5 F (36.9 C), Min:98.5 F (36.9 C), Max:98.5 F (36.9 C)  Recent Labs  Lab 11/18/17 2101 11/18/17 2102  WBC 15.6*  --   CREATININE 0.63  --   LATICACIDVEN  --  1.5    Estimated Creatinine Clearance: 60 mL/min (by C-G formula based on SCr of 0.63 mg/dL).    No Known Allergies   Thank you for allowing pharmacy to be a part of this patient's care.  Thomasene Rippleavid Susi Goslin, PharmD, BCPS Clinical Pharmacist 11/19/2017

## 2017-11-19 NOTE — Clinical Social Work Note (Signed)
Clinical Social Work Assessment  Patient Details  Name: Dominique Patel MRN: [id number] Date of Birth: 10/14/1934  Date of referral:  11/19/17               Reason for consult:  Facility Placement                Permission sought to share information with:  Facility Contact Representative Permission granted to share information::  Yes, Verbal Permission Granted  Name::        Agency::  [facility]  Relationship::     Contact Information:     Housing/Transportation Living arrangements for the past 2 months:  Assisted Living Facility Source of Information:  Patient, Medical Team, Facility Patient Interpreter Needed:  None Criminal Activity/Legal Involvement Pertinent to Current Situation/Hospitalization:  No - Comment as needed Significant Relationships:  Other Family Members Lives with:  Facility Resident Do you feel safe going back to the place where you live?  Yes Need for family participation in patient care:  No (Coment)  Care giving concerns:  Patient admitted from an ALF   Social Worker assessment / plan:  The CSW met with the patient at bedside to discuss discharge planning. The patient shared that she is a resident at [facility], and she "loves living there." The plan is for the patient to return once she is medically stable.   Currently, the patient is receiving IV ABX for sepsis secondary to pneumonia. The patient was alert and oriented during the discussion, and she shared that her nephew is her HCPOA. The CSW contacted the patient's nephew, [name], to advise him of the admission. The patient's nephew is in agreement with return to [facility] and thanked the CSW for the contact and update on his aunt's progress. The CSW will continue to follow for discharge facilitation once the patient is stable.  Employment status:  Retired Insurance information:  Medicare PT Recommendations:  Not assessed at this time Information / Referral to community resources:      Patient/Family's Response to care:  The patient and her nephew thanked the CSW for contact and care.  Patient/Family's Understanding of and Emotional Response to Diagnosis, Current Treatment, and Prognosis: The patient and her nephew are in agreement with the discharge plan back to the ALF of origin once the patient is stable.  Emotional Assessment Appearance:  Appears stated age Attitude/Demeanor/Rapport:  Gracious, Engaged Affect (typically observed):  Accepting, Appropriate, Pleasant Orientation:  Oriented to Self, Oriented to Place, Oriented to  Time, Oriented to Situation Alcohol / Substance use:  Never Used Psych involvement (Current and /or in the community):  No (Comment)  Discharge Needs  Concerns to be addressed:  Care Coordination, Discharge Planning Concerns Readmission within the last 30 days:  No Current discharge risk:  Chronically ill Barriers to Discharge:  Continued Medical Work up    M , LCSW 11/19/2017, 1:13 PM  

## 2017-11-20 LAB — URINE CULTURE

## 2017-11-20 LAB — GLUCOSE, CAPILLARY
GLUCOSE-CAPILLARY: 100 mg/dL — AB (ref 70–99)
Glucose-Capillary: 97 mg/dL (ref 70–99)
Glucose-Capillary: 123 mg/dL — ABNORMAL HIGH (ref 70–99)

## 2017-11-20 MED ORDER — LISINOPRIL 10 MG PO TABS
10.0000 mg | ORAL_TABLET | Freq: Every day | ORAL | Status: DC
  Administered 2017-11-20 – 2017-11-22 (×3): 10 mg via ORAL
  Filled 2017-11-20 (×2): qty 1

## 2017-11-20 MED ORDER — CARVEDILOL 3.125 MG PO TABS
3.1250 mg | ORAL_TABLET | Freq: Once | ORAL | Status: AC
  Administered 2017-11-20: 07:00:00 3.125 mg via ORAL
  Filled 2017-11-20: qty 1

## 2017-11-20 MED ORDER — DILTIAZEM HCL ER COATED BEADS 120 MG PO CP24
120.0000 mg | ORAL_CAPSULE | Freq: Every day | ORAL | Status: DC
  Administered 2017-11-20 – 2017-11-22 (×3): 120 mg via ORAL
  Filled 2017-11-20 (×3): qty 1

## 2017-11-20 NOTE — Progress Notes (Signed)
[facility] [place] at [facility]   PATIENT NAME: Dominique Patel    MR#:  [id number]  DATE OF BIRTH:  [birth year]  SUBJECTIVE:  CHIEF COMPLAINT:   Chief Complaint  Patient presents with  . Shortness of Breath  . Cough   Sent with hypoxia from Assisted living place, On treatment. Pt is confused due to pneumonia and dementia. REVIEW OF SYSTEMS:   Pt have dementia and can not give ROS.  ROS  DRUG ALLERGIES:  No Known Allergies  VITALS:  Blood pressure (!) 171/98, pulse (!) 105, temperature 98.8 F (37.1 C), temperature source Oral, resp. rate (!) 24, height 5\' 5"  (1.651 m), weight 80.7 kg, SpO2 94 %.  PHYSICAL EXAMINATION:  GENERAL:  83 y.o.-year-old patient lying in the bed with no acute distress.  EYES: Pupils equal, round, reactive to light and accommodation. No scleral icterus. Extraocular muscles intact.  HEENT: Head atraumatic, normocephalic. Oropharynx and nasopharynx clear.  NECK:  Supple, no jugular venous distention. No thyroid enlargement, no tenderness.  LUNGS: Normal breath sounds bilaterally, no wheezing, some crepitation. No use of accessory muscles of respiration.  CARDIOVASCULAR: S1, S2 normal. No murmurs, rubs, or gallops.  ABDOMEN: Soft, nontender, nondistended. Bowel sounds present. No organomegaly or mass.  EXTREMITIES: No pedal edema, cyanosis, or clubbing.  NEUROLOGIC: Cranial nerves II through XII are intact. Muscle strength 3-4/5 in all extremities. Sensation intact. Gait not checked.  PSYCHIATRIC: The patient is alert and oriented x 1.  SKIN: No obvious rash, lesion, or ulcer.   Physical Exam LABORATORY PANEL:   CBC Recent Labs  Lab 11/19/17 0623  WBC 14.1*  HGB 14.2  HCT 41.6  PLT 135*   ------------------------------------------------------------------------------------------------------------------  Chemistries  Recent Labs  Lab 11/18/17 2101 11/19/17 0623  NA 137 139  K 4.2 3.8  CL 100 104  CO2 28 26  GLUCOSE  125* 134*  BUN 17 15  CREATININE 0.63 0.71  CALCIUM 9.2 8.5*  AST 35  --   ALT 22  --   ALKPHOS 55  --   BILITOT 1.8*  --    ------------------------------------------------------------------------------------------------------------------  Cardiac Enzymes No results for input(s): TROPONINI in the last 168 hours. ------------------------------------------------------------------------------------------------------------------  RADIOLOGY:  Dg Chest 2 View  Result Date: 11/18/2017 CLINICAL DATA:  83 y/o F; productive cough, shortness of breath, and fatigue over the last 2 days. EXAM: CHEST - 2 VIEW COMPARISON:  09/12/2017 chest radiograph FINDINGS: Stable cardiomegaly given projection and technique. Aortic atherosclerosis with calcification. Increase bronchitic changes and patchy opacities of the lungs. No pleural effusion or pneumothorax. No acute osseous abnormality is evident. IMPRESSION: 1. Increased bronchitic changes and patchy opacities of the lungs probably representing acute bronchitis/early bronchopneumonia. 2. Aortic atherosclerosis. 3. Cardiomegaly. Electronically Signed   By: Mitzi HansenLance  Furusawa-Stratton M.D.   On: 11/18/2017 22:26    ASSESSMENT AND PLAN:   Active Problems:   Sepsis (HCC)  1.  Sepsis, likely secondary to pneumonia.  IV fluids and antibiotics, vancomycin and cefepime IV.  Continue nebulizer treatments and oxygen therapy as needed.  Stopped vanc as MRSA PCR negative. 2.  Facility acquired pneumonia, see treatment as above under #1. 3.  Chronic atrial fibrillation, rate controlled.  Continue anticoagulation with Eliquis to prevent stroke. 4.  Hypertension, stable, continue home medications.   All the records are reviewed and case discussed with Care Management/Social Workerr. Management plans discussed with the patient, family and they are in agreement.  CODE STATUS: Full.  TOTAL TIME TAKING CARE OF THIS PATIENT: 35  minutes.     POSSIBLE D/C IN 1-2  DAYS, DEPENDING ON CLINICAL CONDITION.   Altamese DillingVaibhavkumar Careem Yasui M.D on 11/20/2017   Between 7am to 6pm - Pager - [phone]  After 6pm go to [email or web address] - password [email or web address]  [facility] [place] [facility]  Office  [phone]  CC: Primary care physician; System, Pcp Not In  Note: This dictation was prepared with Dragon dictation along with smaller phrase technology. Any transcriptional errors that result from this process are unintentional.

## 2017-11-20 NOTE — Progress Notes (Signed)
[facility] - [place] at [facility]   PATIENT NAME: Dominique Patel    MR#:  [id number]  DATE OF BIRTH:  [birth year]  SUBJECTIVE:  CHIEF COMPLAINT:   Chief Complaint  Patient presents with  . Shortness of Breath  . Cough   Sent with hypoxia from Assisted living place, On treatment. Pt is confused due to pneumonia and dementia. Feels better. REVIEW OF SYSTEMS:   Pt have dementia and can not give ROS.  ROS  DRUG ALLERGIES:  No Known Allergies  VITALS:  Blood pressure (!) 152/52, pulse 87, temperature 98.2 F (36.8 C), temperature source Oral, resp. rate 20, height 5\' 5"  (1.651 m), weight 80.7 kg, SpO2 94 %.  PHYSICAL EXAMINATION:  GENERAL:  83 y.o.-year-old patient lying in the bed with no acute distress.  EYES: Pupils equal, round, reactive to light and accommodation. No scleral icterus. Extraocular muscles intact.  HEENT: Head atraumatic, normocephalic. Oropharynx and nasopharynx clear.  NECK:  Supple, no jugular venous distention. No thyroid enlargement, no tenderness.  LUNGS: Normal breath sounds bilaterally, no wheezing, some crepitation. No use of accessory muscles of respiration.  CARDIOVASCULAR: S1, S2 normal. No murmurs, rubs, or gallops.  ABDOMEN: Soft, nontender, nondistended. Bowel sounds present. No organomegaly or mass.  EXTREMITIES: No pedal edema, cyanosis, or clubbing.  NEUROLOGIC: Cranial nerves II through XII are intact. Muscle strength 3-4/5 in all extremities. Sensation intact. Gait not checked.  PSYCHIATRIC: The patient is alert and oriented x 1.  SKIN: No obvious rash, lesion, or ulcer.   Physical Exam LABORATORY PANEL:   CBC Recent Labs  Lab 11/19/17 0623  WBC 14.1*  HGB 14.2  HCT 41.6  PLT 135*   ------------------------------------------------------------------------------------------------------------------  Chemistries  Recent Labs  Lab 11/18/17 2101 11/19/17 0623  NA 137 139  K 4.2 3.8  CL 100 104  CO2 28 26   GLUCOSE 125* 134*  BUN 17 15  CREATININE 0.63 0.71  CALCIUM 9.2 8.5*  AST 35  --   ALT 22  --   ALKPHOS 55  --   BILITOT 1.8*  --    ------------------------------------------------------------------------------------------------------------------  Cardiac Enzymes No results for input(s): TROPONINI in the last 168 hours. ------------------------------------------------------------------------------------------------------------------  RADIOLOGY:  Dg Chest 2 View  Result Date: 11/18/2017 CLINICAL DATA:  83 y/o F; productive cough, shortness of breath, and fatigue over the last 2 days. EXAM: CHEST - 2 VIEW COMPARISON:  09/12/2017 chest radiograph FINDINGS: Stable cardiomegaly given projection and technique. Aortic atherosclerosis with calcification. Increase bronchitic changes and patchy opacities of the lungs. No pleural effusion or pneumothorax. No acute osseous abnormality is evident. IMPRESSION: 1. Increased bronchitic changes and patchy opacities of the lungs probably representing acute bronchitis/early bronchopneumonia. 2. Aortic atherosclerosis. 3. Cardiomegaly. Electronically Signed   By: Mitzi HansenLance  Furusawa-Stratton M.D.   On: 11/18/2017 22:26    ASSESSMENT AND PLAN:   Active Problems:   Sepsis (HCC)  1.  Sepsis, likely secondary to pneumonia.  IV fluids and antibiotics, vancomycin and cefepime IV.  Continue nebulizer treatments and oxygen therapy as needed.  Stopped vanc as MRSA PCR negative. 2.  Facility acquired pneumonia, see treatment as above under #1. 3.  Chronic atrial fibrillation, rate controlled.  Continue anticoagulation with Eliquis to prevent stroke. 4.  Hypertension, cont atenolol,a dd lisinopril. 5. Tachycardia- started on Cardizem CD.   All the records are reviewed and case discussed with Care Management/Social Workerr. Management plans discussed with the patient, family and they are in agreement.  CODE STATUS: Full.  TOTAL TIME  TAKING CARE OF THIS  PATIENT: 35 minutes.    POSSIBLE D/C IN 1-2 DAYS, DEPENDING ON CLINICAL CONDITION.   Altamese DillingVaibhavkumar Ayeza Therriault M.D on 11/20/2017   Between 7am to 6pm - Pager - [phone]  After 6pm go to [email or web address] - password [email or web address]  [facility] [place] [facility]  Office  [phone]  CC: Primary care physician; System, Pcp Not In  Note: This dictation was prepared with Dragon dictation along with smaller phrase technology. Any transcriptional errors that result from this process are unintentional.

## 2017-11-21 MED ORDER — PREDNISONE 10 MG (21) PO TBPK: ORAL_TABLET | ORAL | 0 refills | Status: DC

## 2017-11-21 MED ORDER — CEPHALEXIN 500 MG PO CAPS: 500.0000 mg | ORAL_CAPSULE | Freq: Two times a day (BID) | ORAL | 0 refills | Status: AC

## 2017-11-21 MED ORDER — DILTIAZEM HCL ER COATED BEADS 120 MG PO CP24: 120.0000 mg | ORAL_CAPSULE | Freq: Every day | ORAL | 0 refills | Status: DC

## 2017-11-21 MED ORDER — LISINOPRIL 10 MG PO TABS: 10.0000 mg | ORAL_TABLET | Freq: Every day | ORAL | 0 refills | Status: AC

## 2017-11-21 NOTE — Clinical Social Work Note (Signed)
Patient is medically ready for discharge today [facility]. CSW notified patient and nephew Ames CoupeRonald Williams [phone]. Patient will need to be transported by EMS. RN will call report and call for transport.   Ruthe Mannanandace Brandyn Lowrey MSW, [address] [phone]

## 2017-11-21 NOTE — Plan of Care (Signed)

## 2017-11-21 NOTE — Progress Notes (Signed)
Pt is too weak to get up and walk inspite of help by nurse, will cancel d/c to assited living place and get PT eval.

## 2017-11-21 NOTE — Progress Notes (Signed)
DISCHARGE CANCELLED PER  MD. ATTEMPTED TO GET PT OOB TO AMB WITH WALKER. PT DID NOT PERFORM WELLAT ALL. STRONG ASSIST WITH 1 PERSON ON EACH SIDE OF HER.  PT TRIED TO STAND BUT ONLY COULD WITH 2 ASSISTS LIFTING HER. SHE THEN PROCEEDED  TO LEAN HEAVILY OVER WALKER AND WAS UNABLE TO  MOVE HER FEET OR LEGS. P.T. CONSULT ORDERED.

## 2017-11-21 NOTE — Plan of Care (Signed)
Unremarkable night, vss, rested well. No prn pain medication given. Denies any needs

## 2017-11-21 NOTE — Care Management Important Message (Signed)
Important Message  Patient Details  Name: Leontine LocketSally W Walla MRN: [id number] Date of Birth: 1935-03-19   Medicare Important Message Given:  Yes    Olegario MessierKathy A Rajeev Escue 11/21/2017, 11:18 AM

## 2017-11-21 NOTE — Progress Notes (Signed)
[facility] - [place] at [facility]   PATIENT NAME: Dominique HeadsSally Solis    MR#:  [id number]  DATE OF BIRTH:  1934-10-19  SUBJECTIVE:  CHIEF COMPLAINT:   Chief Complaint  Patient presents with  . Shortness of Breath  . Cough   Sent with hypoxia from Assisted living place, On treatment. Pt is confused due to pneumonia and dementia. Feels better.  REVIEW OF SYSTEMS:   Pt have dementia and can not give ROS.  ROS  DRUG ALLERGIES:  No Known Allergies  VITALS:  Blood pressure 140/77, pulse 97, temperature 97.9 F (36.6 C), temperature source Oral, resp. rate 20, height 5\' 5"  (1.651 m), weight 80.7 kg, SpO2 96 %.  PHYSICAL EXAMINATION:  GENERAL:  83 y.o.-year-old patient lying in the bed with no acute distress.  EYES: Pupils equal, round, reactive to light and accommodation. No scleral icterus. Extraocular muscles intact.  HEENT: Head atraumatic, normocephalic. Oropharynx and nasopharynx clear.  NECK:  Supple, no jugular venous distention. No thyroid enlargement, no tenderness.  LUNGS: Normal breath sounds bilaterally, no wheezing, some crepitation. No use of accessory muscles of respiration.  CARDIOVASCULAR: S1, S2 normal. No murmurs, rubs, or gallops.  ABDOMEN: Soft, nontender, nondistended. Bowel sounds present. No organomegaly or mass.  EXTREMITIES: No pedal edema, cyanosis, or clubbing.  NEUROLOGIC: Cranial nerves II through XII are intact. Muscle strength 3-4/5 in all extremities. Sensation intact. Gait not checked.  PSYCHIATRIC: The patient is alert and oriented x 1.  SKIN: No obvious rash, lesion, or ulcer.   Physical Exam LABORATORY PANEL:   CBC Recent Labs  Lab 11/19/17 0623  WBC 14.1*  HGB 14.2  HCT 41.6  PLT 135*   ------------------------------------------------------------------------------------------------------------------  Chemistries  Recent Labs  Lab 11/18/17 2101 11/19/17 0623  NA 137 139  K 4.2 3.8  CL 100 104  CO2 28 26   GLUCOSE 125* 134*  BUN 17 15  CREATININE 0.63 0.71  CALCIUM 9.2 8.5*  AST 35  --   ALT 22  --   ALKPHOS 55  --   BILITOT 1.8*  --    ------------------------------------------------------------------------------------------------------------------  Cardiac Enzymes No results for input(s): TROPONINI in the last 168 hours. ------------------------------------------------------------------------------------------------------------------  RADIOLOGY:  No results found.  ASSESSMENT AND PLAN:   Active Problems:   Sepsis (HCC)  1.  Sepsis, likely secondary to pneumonia.  IV fluids and antibiotics, vancomycin and cefepime IV.  Continue nebulizer treatments and oxygen therapy as needed.  Stopped vanc as MRSA PCR negative. 2.  Facility acquired pneumonia, see treatment as above under #1. 3.  Chronic atrial fibrillation, rate controlled.  Continue anticoagulation with Eliquis to prevent stroke. 4.  Hypertension, cont atenolol,add lisinopril. 5. Tachycardia- started on Cardizem CD. 6. Generalized weakness- Need PT eval, cancel discharge, may need rehab??  All the records are reviewed and case discussed with Care Management/Social Workerr. Management plans discussed with the patient, family and they are in agreement.  CODE STATUS: Full.  TOTAL TIME TAKING CARE OF THIS PATIENT: 35 minutes.    POSSIBLE D/C IN 1-2 DAYS, DEPENDING ON CLINICAL CONDITION.   Altamese DillingVaibhavkumar Colleene Swarthout M.D on 11/21/2017   Between 7am to 6pm - Pager - [phone]  After 6pm go to [email or web address] - password [email or web address]  [facility] [place] [facility]  Office  [phone]  CC: Primary care physician; System, Pcp Not In  Note: This dictation was prepared with Dragon dictation along with smaller phrase technology. Any transcriptional errors that result from this process are unintentional.

## 2017-11-21 NOTE — Discharge Summary (Signed)
[facility] -  at [facility]   PATIENT NAME: Dominique HeadsSally Patel    MR#:  [id number]  DATE OF BIRTH:  [birth year]  DATE OF ADMISSION:  11/18/2017 ADMITTING PHYSICIAN: Cammy CopaAngela Maier, MD  DATE OF DISCHARGE: 11/21/2017   PRIMARY CARE PHYSICIAN: System, Pcp Not In    ADMISSION DIAGNOSIS:  Acute respiratory failure with hypoxia (HCC) [J96.01] Sepsis, due to unspecified organism (HCC) [A41.9] Community acquired pneumonia, unspecified laterality [J18.9]  DISCHARGE DIAGNOSIS:  Active Problems:   Sepsis (HCC)   pneumonia  SECONDARY DIAGNOSIS:   Past Medical History:  Diagnosis Date  . Anxiety disorder   . Cataracts, bilateral   . Dementia   . Hypertension   . Major depressive disorder     HOSPITAL COURSE:   1.Sepsis, likely secondary to pneumonia. IV fluids and antibiotics, vancomycin and cefepime IV. Continue nebulizer treatments and oxygen therapy as needed.  Stopped vanc as MRSA PCR negative.  Pt much improved, change to keflex on discharge. 2.Facility acquired pneumonia,see treatment as above under #1. 3.Chronic atrial fibrillation,rate controlled.Continue anticoagulation with Eliquis to prevent stroke. 4.Hypertension,cont atenolol,a dd lisinopril. 5. Tachycardia- started on Cardizem CD. 6. Wheezing- will give tapering steroids on discharge.  DISCHARGE CONDITIONS:   Stable.  CONSULTS OBTAINED:    DRUG ALLERGIES:  No Known Allergies  DISCHARGE MEDICATIONS:   Allergies as of 11/21/2017   No Known Allergies     Medication List    STOP taking these medications   azithromycin 250 MG tablet Commonly known as:  ZITHROMAX     TAKE these medications   acetaminophen 650 MG CR tablet Commonly known as:  TYLENOL Take 650 mg by mouth 2 (two) times daily.   albuterol 108 (90 Base) MCG/ACT inhaler Commonly known as:  PROVENTIL HFA;VENTOLIN HFA Inhale 2 puffs into the lungs every 4 (four) hours as needed for wheezing or  shortness of breath (or cough).   apixaban 5 MG Tabs tablet Commonly known as:  ELIQUIS Take 1 tablet (5 mg total) by mouth 2 (two) times daily.   atenolol 50 MG tablet Commonly known as:  TENORMIN Take 1 tablet (50 mg total) by mouth daily.   benzonatate 100 MG capsule Commonly known as:  TESSALON Take 1 capsule (100 mg total) by mouth every 6 (six) hours as needed for cough.   cephALEXin 500 MG capsule Commonly known as:  KEFLEX Take 1 capsule (500 mg total) by mouth 2 (two) times daily for 3 days.   cholecalciferol 1000 units tablet Commonly known as:  VITAMIN D Take 2,000 Units by mouth daily.   diltiazem 120 MG 24 hr capsule Commonly known as:  CARDIZEM CD Take 1 capsule (120 mg total) by mouth daily. Start taking on:  11/22/2017   donepezil 10 MG tablet Commonly known as:  ARICEPT Take 1 tablet by mouth at bedtime.   guaiFENesin 600 MG 12 hr tablet Commonly known as:  MUCINEX Take 600 mg by mouth 2 (two) times daily. For 5 days   lisinopril 10 MG tablet Commonly known as:  PRINIVIL,ZESTRIL Take 1 tablet (10 mg total) by mouth daily. Start taking on:  11/22/2017   predniSONE 10 MG (21) Tbpk tablet Commonly known as:  STERAPRED UNI-PAK 21 TAB Take 6 tabs first day, 5 tab on day 2, then 4 on day 3rd, 3 tabs on day 4th , 2 tab on day 5th, and 1 tab on 6th day.   QUEtiapine 25 MG tablet Commonly known as:  SEROQUEL Take 12.5 mg by  mouth 2 (two) times daily.   sertraline 25 MG tablet Commonly known as:  ZOLOFT Take 1 tablet by mouth every other day.   Spacer/Aero-Holding Rudean Curt 1 puff by Does not apply route every 4 (four) hours as needed.   traMADol 50 MG tablet Commonly known as:  ULTRAM Take 25 mg by mouth daily. And 25MG  by mouth every 8 hours as needed for pain   trimethoprim-polymyxin b ophthalmic solution Commonly known as:  POLYTRIM Place 1 drop into both eyes every 6 (six) hours. For 5 days   vitamin B-12 250 MCG tablet Commonly known as:   CYANOCOBALAMIN Take 250 mcg by mouth daily.        DISCHARGE INSTRUCTIONS:    Follow with PMD in 1-2 weeks.  If you experience worsening of your admission symptoms, develop shortness of breath, life threatening emergency, suicidal or homicidal thoughts you must seek medical attention immediately by calling 911 or calling your MD immediately  if symptoms less severe.  You Must read complete instructions/literature along with all the possible adverse reactions/side effects for all the Medicines you take and that have been prescribed to you. Take any new Medicines after you have completely understood and accept all the possible adverse reactions/side effects.   Please note  You were cared for by a hospitalist during your hospital stay. If you have any questions about your discharge medications or the care you received while you were in the hospital after you are discharged, you can call the unit and asked to speak with the hospitalist on call if the hospitalist that took care of you is not available. Once you are discharged, your primary care physician will handle any further medical issues. Please note that NO REFILLS for any discharge medications will be authorized once you are discharged, as it is imperative that you return to your primary care physician (or establish a relationship with a primary care physician if you do not have one) for your aftercare needs so that they can reassess your need for medications and monitor your lab values.    Today   CHIEF COMPLAINT:   Chief Complaint  Patient presents with  . Shortness of Breath  . Cough    HISTORY OF PRESENT ILLNESS:  Dominique Patel  is a 83 y.o. female with a known history of dementia, hypertension, depression disorder, anxiety disorder.  She also has history of chronic atrial fibrillation for which she is on Eliquis. Patient is unable to provide history due to dementia.  Information was taken from reviewing the medical chart and  from discussion with emergency room physician. According to the facility, patient has had productive cough, fatigue and shortness of breath going on for the past 2 to 3 days.  She was started on a azithromycin p.o. 2 days ago, by the rounding physician at the assisted living facility.  No improvement noted. When EMS was called, patient was found with tachycardia, tachypnea and hypoxia.  Oxygen saturation was 80% on room air.  Temperature was 100.4. No reports of chest pain, nausea, vomiting, abdominal pain, diarrhea, bleeding. Blood test done emergency room are remarkable for elevated WBC at 15,000.  Troponin level is lower than 0.03.  UA is negative for bacteria. Chest x-ray shows increased bronchitic changes and patchy opacities of the lungs probably representing acute bronchitis/early bronchopneumonia. Patient is admitted for further evaluation and treatment.  VITAL SIGNS:  Blood pressure 140/77, pulse 97, temperature 97.9 F (36.6 C), temperature source Oral, resp. rate 20, height 5'  5" (1.651 m), weight 80.7 kg, SpO2 96 %.  I/O:    Intake/Output Summary (Last 24 hours) at 11/21/2017 1539 Last data filed at 11/21/2017 1011 Gross per 24 hour  Intake 460 ml  Output -  Net 460 ml    PHYSICAL EXAMINATION:   GENERAL:  83 y.o.-year-old patient lying in the bed with no acute distress.  EYES: Pupils equal, round, reactive to light and accommodation. No scleral icterus. Extraocular muscles intact.  HEENT: Head atraumatic, normocephalic. Oropharynx and nasopharynx clear.  NECK:  Supple, no jugular venous distention. No thyroid enlargement, no tenderness.  LUNGS: Normal breath sounds bilaterally, no wheezing, some crepitation. No use of accessory muscles of respiration.  CARDIOVASCULAR: S1, S2 normal. No murmurs, rubs, or gallops.  ABDOMEN: Soft, nontender, nondistended. Bowel sounds present. No organomegaly or mass.  EXTREMITIES: No pedal edema, cyanosis, or clubbing.  NEUROLOGIC: Cranial  nerves II through XII are intact. Muscle strength 3-4/5 in all extremities. Sensation intact. Gait not checked.  PSYCHIATRIC: The patient is alert and oriented x 1.  SKIN: No obvious rash, lesion, or ulcer.   DATA REVIEW:   CBC Recent Labs  Lab 11/19/17 0623  WBC 14.1*  HGB 14.2  HCT 41.6  PLT 135*    Chemistries  Recent Labs  Lab 11/18/17 2101 11/19/17 0623  NA 137 139  K 4.2 3.8  CL 100 104  CO2 28 26  GLUCOSE 125* 134*  BUN 17 15  CREATININE 0.63 0.71  CALCIUM 9.2 8.5*  AST 35  --   ALT 22  --   ALKPHOS 55  --   BILITOT 1.8*  --     Cardiac Enzymes No results for input(s): TROPONINI in the last 168 hours.  Microbiology Results  Results for orders placed or performed during the hospital encounter of 11/18/17  Culture, blood (Routine x 2)     Status: None (Preliminary result)   Collection Time: 11/18/17  9:02 PM  Result Value Ref Range Status   Specimen Description BLOOD RIGHT ANTECUBITAL  Final   Special Requests   Final    BOTTLES DRAWN AEROBIC AND ANAEROBIC Blood Culture adequate volume   Culture   Final    NO GROWTH 3 DAYS Performed at [facility], [address] [place], [place] [address]    Report Status PENDING  Incomplete  Urine culture     Status: Abnormal   Collection Time: 11/18/17  9:02 PM  Result Value Ref Range Status   Specimen Description   Final    URINE, RANDOM Performed at [facility], [address] [place], [place] [address]    Special Requests   Final    NONE Performed at [facility], [address] [place], [place] [address]    Culture MULTIPLE SPECIES PRESENT, SUGGEST RECOLLECTION (A)  Final   Report Status 11/20/2017 FINAL  Final  Culture, blood (Routine x 2)     Status: None (Preliminary result)   Collection Time: 11/18/17  9:07 PM  Result Value Ref Range Status   Specimen Description BLOOD BLOOD LEFT WRIST  Final   Special Requests   Final    BOTTLES DRAWN AEROBIC AND ANAEROBIC Blood  Culture adequate volume   Culture   Final    NO GROWTH 3 DAYS Performed at [facility], [address] [place], [place] [address]    Report Status PENDING  Incomplete  MRSA PCR Screening     Status: None   Collection Time: 11/19/17  4:58  AM  Result Value Ref Range Status   MRSA by PCR NEGATIVE NEGATIVE Final    Comment:        The GeneXpert MRSA Assay (FDA approved for NASAL specimens only), is one component of a comprehensive MRSA colonization surveillance program. It is not intended to diagnose MRSA infection nor to guide or monitor treatment for MRSA infections. Performed at [facility], [address] [place], [place] [address]     RADIOLOGY:  No results found.  EKG:   Orders placed or performed during the hospital encounter of 11/18/17  . ED EKG 12-Lead  . ED EKG 12-Lead  . EKG 12-Lead  . EKG 12-Lead      Management plans discussed with the patient, family and they are in agreement.  CODE STATUS:     Code Status Orders  (From admission, onward)         Start     Ordered   11/19/17 0240  Full code  Continuous     11/19/17 0239        Code Status History    Date Active Date Inactive Code Status Order ID Comments User Context   11/27/2015 2202 11/29/2015 1557 Full Code [id number]  Oralia Manis, MD Inpatient      TOTAL TIME TAKING CARE OF THIS PATIENT: 35 minutes.    Altamese Dilling M.D on 11/21/2017 at 3:39 PM  Between 7am to 6pm - Pager - [phone]  After 6pm go to [email or web address] - password [email or web address]  [facility]  Office  [phone]  CC: Primary care physician; System, Pcp Not In   Note: This dictation was prepared with Dragon dictation along with smaller phrase technology. Any transcriptional errors that result from this process are unintentional.

## 2017-11-22 LAB — CBC
HCT: 41.7 % (ref 35.0–47.0)
Hemoglobin: 14.3 g/dL (ref 12.0–16.0)
MCH: 32 pg (ref 26.0–34.0)
MCHC: 34.3 g/dL (ref 32.0–36.0)
MCV: 93.4 fL (ref 80.0–100.0)
PLATELETS: 187 10*3/uL (ref 150–440)
RBC: 4.46 MIL/uL (ref 3.80–5.20)
RDW: 12.8 % (ref 11.5–14.5)
WBC: 10.1 10*3/uL (ref 3.6–11.0)

## 2017-11-22 LAB — GLUCOSE, CAPILLARY: Glucose-Capillary: 106 mg/dL — ABNORMAL HIGH (ref 70–99)

## 2017-11-22 LAB — CREATININE, SERUM
Creatinine, Ser: 0.6 mg/dL (ref 0.44–1.00)
GFR calc Af Amer: >60 mL/min (ref >60)

## 2017-11-22 MED ORDER — TRAMADOL HCL 50 MG PO TABS: 25.0000 mg | ORAL_TABLET | Freq: Every day | ORAL | 0 refills | Status: DC

## 2017-11-22 NOTE — Clinical Social Work Note (Signed)
Patient is medically ready for discharge to [facility] today. CSW notified patient and nephew Ames CoupeRonald Williams [phone]. Both are in agreement with discharge to [facility]. CSW also notified [name], admissions coordinator at [facility] of discharge today. Patient will be transported by EMS. RN to call report and call for transport.   Ruthe Mannanandace Tonye Tancredi MSW, [address] [phone]

## 2017-11-22 NOTE — Progress Notes (Signed)
Physical Therapy Treatment Patient Details Name: Dominique Patel MRN: [id number] DOB: 1934/05/31 Today's Date: 11/22/2017    History of Present Illness Pt is a [age] y.o. female that was addmitted to the hospital for sepsis and c/c of SOB and cough. Pt PMH includes dementia, HTN, depression, anxiety, and A-Fib.  Pt was addmitted on 8/16 and plan was to d/c 8/19 which was cancled 2/2 increased weakness.    PT Comments    Pt is a pleasant 83 year old female who was admitted for sepsis. Pt had mild confusion and was poor historian during EVAL. Pt performs bed mobility with Mod A, transfers sit to stand Mod A, and ambulates 4' to recliner with Mod A and RW. Pt demonstrates deficits with functional mobility 2/2 weakness, fatigue, pain, poor motor planning, and decreased knowledge of DME. Pt Would benefit from skilled PT to address above deficits and promote optimal return to PLOF. PT recommends d/c to SNF 2/2 above stated deficits.  Follow Up Recommendations  SNF     Equipment Recommendations  None recommended by PT(per SNF rec)    Recommendations for Other Services       Precautions / Restrictions Precautions Precautions: Fall Restrictions Weight Bearing Restrictions: No    Mobility  Bed Mobility Overal bed mobility: Needs Assistance Bed Mobility: Supine to Sit     Supine to sit: Mod assist     General bed mobility comments: Pt required Mod A at R hand held and LEs secondary to weakness, and fatigue. Pt did make good effort to get OOB and rated RPE as moderate  Transfers Overall transfer level: Needs assistance Equipment used: Rolling walker (2 wheeled) Transfers: Sit to/from Stand Sit to Stand: Mod assist         General transfer comment: pt required Mod A for sit<>stand at hips and trunk as well as blocking L LE 2/2 weakness, fatigue, fear of falling. Pt benefited from multimodal cueing to stand tall and prevent hip flexion.  Ambulation/Gait Ambulation/Gait assistance:  Mod assist Gait Distance (Feet): 4 Feet Assistive device: Rolling walker (2 wheeled) Gait Pattern/deviations: Shuffle     General Gait Details: Pt required Mod A at gait belt, hips, and RW to ambulate 4' to recliner 2/2 weakness, fear of falling, fatigue, and poor motor planning. Pt beneifited from mutlimodal cueing for propper use of RW and to aid in standing posture. Pt was barely able to clear L LE off ground due to R LE pain/weakness.   Stairs             Wheelchair Mobility    Modified Rankin (Stroke Patients Only)       Balance Overall balance assessment: Needs assistance Sitting-balance support: No upper extremity supported;Feet supported Sitting balance-Leahy Scale: Fair   Postural control: Other (comment)(hip flexion with posterior lean) Standing balance support: Bilateral upper extremity supported Standing balance-Leahy Scale: Poor                              Cognition Arousal/Alertness: Awake/alert Behavior During Therapy: WFL for tasks assessed/performed Overall Cognitive Status: No family/caregiver present to determine baseline cognitive functioning                                 General Comments: Pt was able to answer simple question and follow multistep commands, but she struggled with complex questions and was only oreited to self.  Exercises Other Exercises Other Exercises: pt instructed in supine there.ex to include ankle pumps x10, heel slides x10, hip abd/add x10, and Min A SLR x10 all B LEs.    General Comments        Pertinent Vitals/Pain Pain Assessment: Faces Faces Pain Scale: Hurts little more Pain Location: R foot Pain Descriptors / Indicators: Grimacing;Guarding;Moaning Pain Intervention(s): Limited activity within patient's tolerance;Monitored during session;Repositioned    Home Living Family/patient expects to be discharged to:: Assisted living             Home Equipment: Walker - 2 wheels       Prior Function Level of Independence: Needs assistance  Gait / Transfers Assistance Needed: Mod I with RW ADL's / Homemaking Assistance Needed: Mod I with clothing, toielting, eating, but required assist with more clomplex IADLs like cooking and housekeeping Comments: Pt is poor historian and unclear with level of assistance required   PT Goals (current goals can now be found in the care plan section) Acute Rehab PT Goals Patient Stated Goal: return to PLOF PT Goal Formulation: With patient Time For Goal Achievement: 12/06/17 Potential to Achieve Goals: Fair    Frequency    Min 2X/week      PT Plan      Co-evaluation              AM-PAC PT "6 Clicks" Daily Activity  Outcome Measure  Difficulty turning over in bed (including adjusting bedclothes, sheets and blankets)?: Unable Difficulty moving from lying on back to sitting on the side of the bed? : Unable Difficulty sitting down on and standing up from a chair with arms (e.g., wheelchair, bedside commode, etc,.)?: Unable Help needed moving to and from a bed to chair (including a wheelchair)?: A Lot Help needed walking in hospital room?: A Lot Help needed climbing 3-5 steps with a railing? : Total 6 Click Score: 8    End of Session Equipment Utilized During Treatment: Gait belt Activity Tolerance: Patient limited by fatigue;Patient limited by pain Patient left: in chair;with chair alarm set;with call bell/phone within reach Nurse Communication: Mobility status;Other (comment)(increase pain in R foot/LE) PT Visit Diagnosis: Unsteadiness on feet (R26.81);Other abnormalities of gait and mobility (R26.89);Muscle weakness (generalized) (M62.81);History of falling (Z91.81);Difficulty in walking, not elsewhere classified (R26.2);Pain Pain - Right/Left: Right Pain - part of body: Ankle and joints of foot     Time: [id number] PT Time Calculation (min) (ACUTE ONLY): 28 min  Charges:                        Renaldo HarrisonStephen  Ronya Gilcrest, SPT    Renaldo HarrisonStephen Azul Coffie 11/22/2017, 12:10 PM

## 2017-11-22 NOTE — Progress Notes (Signed)
Pt for discharge to [facility] via ems. They are here now to transport. No distress.  Pt in transfer pack.report called to melinda at [facility].

## 2017-11-22 NOTE — Discharge Summary (Signed)
[facility] [place] [facility]   PATIENT NAME: Ladell HeadsSally Timoney    MR#:  [id number]  DATE OF BIRTH:  11/27/1934  DATE OF ADMISSION:  11/18/2017 ADMITTING PHYSICIAN: Cammy CopaAngela Maier, MD  DATE OF DISCHARGE: 11/22/2017   PRIMARY CARE PHYSICIAN: System, Pcp Not In    ADMISSION DIAGNOSIS:  Acute respiratory failure with hypoxia (HCC) [J96.01] Sepsis, due to unspecified organism (HCC) [A41.9] Community acquired pneumonia, unspecified laterality [J18.9]  DISCHARGE DIAGNOSIS:  Active Problems:   Sepsis (HCC)   pneumonia  SECONDARY DIAGNOSIS:   Past Medical History:  Diagnosis Date  . Anxiety disorder   . Cataracts, bilateral   . Dementia   . Hypertension   . Major depressive disorder     HOSPITAL COURSE:   1.Sepsis, likely secondary to pneumonia. IV fluids and antibiotics, vancomycin and cefepime IV. Continue nebulizer treatments and oxygen therapy as needed.  Stopped vanc as MRSA PCR negative.  Pt much improved, change to keflex on discharge. 2.Facility acquired pneumonia,see treatment as above under #1. 3.Chronic atrial fibrillation,rate controlled.Continue anticoagulation with Eliquis to prevent stroke. 4.Hypertension,cont atenolol,a dd lisinopril. 5. Tachycardia- started on Cardizem CD. 6. Wheezing- will give tapering steroids on discharge.  With generalized weakness- she could not get up without support, need to have rehab.  DISCHARGE CONDITIONS:   Stable.  CONSULTS OBTAINED:    DRUG ALLERGIES:  No Known Allergies  DISCHARGE MEDICATIONS:   Allergies as of 11/22/2017   No Known Allergies     Medication List    STOP taking these medications   azithromycin 250 MG tablet Commonly known as:  ZITHROMAX     TAKE these medications   acetaminophen 650 MG CR tablet Commonly known as:  TYLENOL Take 650 mg by mouth 2 (two) times daily.   albuterol 108 (90 Base) MCG/ACT inhaler Commonly known as:  PROVENTIL  HFA;VENTOLIN HFA Inhale 2 puffs into the lungs every 4 (four) hours as needed for wheezing or shortness of breath (or cough).   apixaban 5 MG Tabs tablet Commonly known as:  ELIQUIS Take 1 tablet (5 mg total) by mouth 2 (two) times daily.   atenolol 50 MG tablet Commonly known as:  TENORMIN Take 1 tablet (50 mg total) by mouth daily.   benzonatate 100 MG capsule Commonly known as:  TESSALON Take 1 capsule (100 mg total) by mouth every 6 (six) hours as needed for cough.   cephALEXin 500 MG capsule Commonly known as:  KEFLEX Take 1 capsule (500 mg total) by mouth 2 (two) times daily for 3 days.   cholecalciferol 1000 units tablet Commonly known as:  VITAMIN D Take 2,000 Units by mouth daily.   diltiazem 120 MG 24 hr capsule Commonly known as:  CARDIZEM CD Take 1 capsule (120 mg total) by mouth daily.   donepezil 10 MG tablet Commonly known as:  ARICEPT Take 1 tablet by mouth at bedtime.   guaiFENesin 600 MG 12 hr tablet Commonly known as:  MUCINEX Take 600 mg by mouth 2 (two) times daily. For 5 days   lisinopril 10 MG tablet Commonly known as:  PRINIVIL,ZESTRIL Take 1 tablet (10 mg total) by mouth daily.   predniSONE 10 MG (21) Tbpk tablet Commonly known as:  STERAPRED UNI-PAK 21 TAB Take 6 tabs first day, 5 tab on day 2, then 4 on day 3rd, 3 tabs on day 4th , 2 tab on day 5th, and 1 tab on 6th day.   QUEtiapine 25 MG tablet Commonly known as:  SEROQUEL Take 12.5 mg by mouth 2 (two) times daily.   sertraline 25 MG tablet Commonly known as:  ZOLOFT Take 1 tablet by mouth every other day.   Spacer/Aero-Holding Rudean Curt 1 puff by Does not apply route every 4 (four) hours as needed.   traMADol 50 MG tablet Commonly known as:  ULTRAM Take 0.5 tablets (25 mg total) by mouth daily. And 25MG  by mouth every 8 hours as needed for pain   trimethoprim-polymyxin b ophthalmic solution Commonly known as:  POLYTRIM Place 1 drop into both eyes every 6 (six) hours. For 5  days   vitamin B-12 250 MCG tablet Commonly known as:  CYANOCOBALAMIN Take 250 mcg by mouth daily.        DISCHARGE INSTRUCTIONS:    Follow with PMD in 1-2 weeks.  If you experience worsening of your admission symptoms, develop shortness of breath, life threatening emergency, suicidal or homicidal thoughts you must seek medical attention immediately by calling 911 or calling your MD immediately  if symptoms less severe.  You Must read complete instructions/literature along with all the possible adverse reactions/side effects for all the Medicines you take and that have been prescribed to you. Take any new Medicines after you have completely understood and accept all the possible adverse reactions/side effects.   Please note  You were cared for by a hospitalist during your hospital stay. If you have any questions about your discharge medications or the care you received while you were in the hospital after you are discharged, you can call the unit and asked to speak with the hospitalist on call if the hospitalist that took care of you is not available. Once you are discharged, your primary care physician will handle any further medical issues. Please note that NO REFILLS for any discharge medications will be authorized once you are discharged, as it is imperative that you return to your primary care physician (or establish a relationship with a primary care physician if you do not have one) for your aftercare needs so that they can reassess your need for medications and monitor your lab values.    Today   CHIEF COMPLAINT:   Chief Complaint  Patient presents with  . Shortness of Breath  . Cough    HISTORY OF PRESENT ILLNESS:  Chalise Pe  is a 83 y.o. female with a known history of dementia, hypertension, depression disorder, anxiety disorder.  She also has history of chronic atrial fibrillation for which she is on Eliquis. Patient is unable to provide history due to dementia.   Information was taken from reviewing the medical chart and from discussion with emergency room physician. According to the facility, patient has had productive cough, fatigue and shortness of breath going on for the past 2 to 3 days.  She was started on a azithromycin p.o. 2 days ago, by the rounding physician at the assisted living facility.  No improvement noted. When EMS was called, patient was found with tachycardia, tachypnea and hypoxia.  Oxygen saturation was 80% on room air.  Temperature was 100.4. No reports of chest pain, nausea, vomiting, abdominal pain, diarrhea, bleeding. Blood test done emergency room are remarkable for elevated WBC at 15,000.  Troponin level is lower than 0.03.  UA is negative for bacteria. Chest x-ray shows increased bronchitic changes and patchy opacities of the lungs probably representing acute bronchitis/early bronchopneumonia. Patient is admitted for further evaluation and treatment.  VITAL SIGNS:  Blood pressure (!) 119/55, pulse 76, temperature 98.3 F (36.8  C), temperature source Oral, resp. rate 18, height 5\' 5"  (1.651 m), weight 79.8 kg, SpO2 96 %.  I/O:    Intake/Output Summary (Last 24 hours) at 11/22/2017 1309 Last data filed at 11/22/2017 1032 Gross per 24 hour  Intake 240 ml  Output -  Net 240 ml    PHYSICAL EXAMINATION:   GENERAL:  83 y.o.-year-old patient lying in the bed with no acute distress.  EYES: Pupils equal, round, reactive to light and accommodation. No scleral icterus. Extraocular muscles intact.  HEENT: Head atraumatic, normocephalic. Oropharynx and nasopharynx clear.  NECK:  Supple, no jugular venous distention. No thyroid enlargement, no tenderness.  LUNGS: Normal breath sounds bilaterally, no wheezing, some crepitation. No use of accessory muscles of respiration.  CARDIOVASCULAR: S1, S2 normal. No murmurs, rubs, or gallops.  ABDOMEN: Soft, nontender, nondistended. Bowel sounds present. No organomegaly or mass.  EXTREMITIES:  No pedal edema, cyanosis, or clubbing.  NEUROLOGIC: Cranial nerves II through XII are intact. Muscle strength 3-4/5 in all extremities. Sensation intact. Gait not checked.  PSYCHIATRIC: The patient is alert and oriented x 1.  SKIN: No obvious rash, lesion, or ulcer.   DATA REVIEW:   CBC Recent Labs  Lab 11/22/17 0417  WBC 10.1  HGB 14.3  HCT 41.7  PLT 187    Chemistries  Recent Labs  Lab 11/18/17 2101 11/19/17 0623 11/22/17 0417  NA 137 139  --   K 4.2 3.8  --   CL 100 104  --   CO2 28 26  --   GLUCOSE 125* 134*  --   BUN 17 15  --   CREATININE 0.63 0.71 0.60  CALCIUM 9.2 8.5*  --   AST 35  --   --   ALT 22  --   --   ALKPHOS 55  --   --   BILITOT 1.8*  --   --     Cardiac Enzymes No results for input(s): TROPONINI in the last 168 hours.  Microbiology Results  Results for orders placed or performed during the hospital encounter of 11/18/17  Culture, blood (Routine x 2)     Status: None (Preliminary result)   Collection Time: 11/18/17  9:02 PM  Result Value Ref Range Status   Specimen Description BLOOD RIGHT ANTECUBITAL  Final   Special Requests   Final    BOTTLES DRAWN AEROBIC AND ANAEROBIC Blood Culture adequate volume   Culture   Final    NO GROWTH 4 DAYS Performed at [facility], [address] [place], [place] [address]    Report Status PENDING  Incomplete  Urine culture     Status: Abnormal   Collection Time: 11/18/17  9:02 PM  Result Value Ref Range Status   Specimen Description   Final    URINE, RANDOM Performed at [facility], [address], [place], [place] [address]    Special Requests   Final    NONE Performed at [facility], [address] [place], [place] [address]    Culture MULTIPLE SPECIES PRESENT, SUGGEST RECOLLECTION (A)  Final   Report Status 11/20/2017 FINAL  Final  Culture, blood (Routine x 2)     Status: None (Preliminary result)   Collection Time: 11/18/17  9:07 PM  Result Value Ref Range  Status   Specimen Description BLOOD BLOOD LEFT WRIST  Final   Special Requests   Final    BOTTLES DRAWN AEROBIC AND ANAEROBIC Blood Culture adequate volume   Culture  Final    NO GROWTH 4 DAYS Performed at [facility], [address] [place], [place] [address]    Report Status PENDING  Incomplete  MRSA PCR Screening     Status: None   Collection Time: 11/19/17  4:58 AM  Result Value Ref Range Status   MRSA by PCR NEGATIVE NEGATIVE Final    Comment:        The GeneXpert MRSA Assay (FDA approved for NASAL specimens only), is one component of a comprehensive MRSA colonization surveillance program. It is not intended to diagnose MRSA infection nor to guide or monitor treatment for MRSA infections. Performed at [facility], [address] [place], [place] [address]     RADIOLOGY:  No results found.  EKG:   Orders placed or performed during the hospital encounter of 11/18/17  . ED EKG 12-Lead  . ED EKG 12-Lead  . EKG 12-Lead  . EKG 12-Lead      Management plans discussed with the patient, family and they are in agreement.  CODE STATUS:     Code Status Orders  (From admission, onward)         Start     Ordered   11/19/17 0240  Full code  Continuous     11/19/17 0239        Code Status History    Date Active Date Inactive Code Status Order ID Comments User Context   11/27/2015 2202 11/29/2015 1557 Full Code [id number]  Oralia ManisWillis, David, MD Inpatient      TOTAL TIME TAKING CARE OF THIS PATIENT: 35 minutes.    Altamese DillingVaibhavkumar Koleton Duchemin M.D on 11/22/2017 at 1:09 PM  Between 7am to 6pm - Pager - [phone]  After 6pm go to [email or web address] - password [email or web address]  [facility]  Office  [phone]  CC: Primary care physician; System, Pcp Not In   Note: This dictation was prepared with Dragon dictation along with smaller phrase technology. Any transcriptional errors that result from this process are unintentional.

## 2017-11-22 NOTE — Clinical Social Work Note (Signed)
CSW spoke with patient's nephew Ames CoupeRonald Williams regarding patient need for SNF. CSW gave nephew the bed offers received for patient. Nephew chose [facility]. CSW notified [name], Scientist, research (physical sciences)Admissions Coordinator at [facility] of bed acceptance. CSW will continue to follow for discharge planning.   Ruthe Mannanandace Ferris Fielden MSW, [address] [phone]

## 2017-11-22 NOTE — [place] FL2 (Signed)
[place] MEDICAID FL2 LEVEL OF CARE SCREENING TOOL     IDENTIFICATION  Patient Name: Dominique Patel Birthdate: 06/21/1934 Sex: female Admission Date (Current Location): 11/18/2017  [place]   Facility and Address:  [facility], [address], [place], [place] [address]      Provider Number: [id number]  Attending Physician Name and Address:  Altamese DillingVachhani, Vaibhavkumar, *  Relative Name and Phone Number:  Damita LackRonald Williams- Nephew [phone]    Current Level of Care: Hospital Recommended Level of Care: Skilled Nursing Facility Prior Approval Number:    Date Approved/Denied:   PASRR Number:    Discharge Plan: SNF    Current Diagnoses: Patient Active Problem List   Diagnosis Date Noted  . Sepsis (HCC) 11/18/2017  . HCAP (healthcare-associated pneumonia) 11/27/2015  . HTN (hypertension) 11/27/2015  . New onset atrial fibrillation (HCC) 11/27/2015  . Major depressive disorder 08/20/2015  . Dementia with psychosis 08/20/2015    Orientation RESPIRATION BLADDER Height & Weight     Self, Time, Place  Normal Continent Weight: 176 lb (79.8 kg) Height:  5\' 5"  (165.1 cm)  BEHAVIORAL SYMPTOMS/MOOD NEUROLOGICAL BOWEL NUTRITION STATUS  (none) (none) Continent Diet(Heart Healthy )  AMBULATORY STATUS COMMUNICATION OF NEEDS Skin   Extensive Assist Verbally Normal                       Personal Care Assistance Level of Assistance  Bathing, Feeding, Dressing Bathing Assistance: Limited assistance Feeding assistance: Independent Dressing Assistance: Limited assistance     Functional Limitations Info  Sight, Hearing, Speech Sight Info: Adequate Hearing Info: Adequate Speech Info: Adequate    SPECIAL CARE FACTORS FREQUENCY  PT (By licensed PT), OT (By licensed OT)     PT Frequency: 5 OT Frequency: 5            Contractures Contractures Info: Not present    Additional Factors Info  Code Status, Allergies  Code Status Info: Full Code  Allergies Info: NKA Psychotropic Info: Seroquel, Zoloft, Trazodone         Current Medications (11/22/2017):  This is the current hospital active medication list Current Facility-Administered Medications  Medication Dose Route Frequency Provider Last Rate Last Dose  . acetaminophen (TYLENOL) tablet 650 mg  650 mg Oral Q6H PRN Cammy CopaMaier, Angela, MD       Or  . acetaminophen (TYLENOL) suppository 650 mg  650 mg Rectal Q6H PRN Cammy CopaMaier, Angela, MD      . apixaban Everlene Balls(ELIQUIS) tablet 5 mg  5 mg Oral BID Cammy CopaMaier, Angela, MD   5 mg at 11/21/17 2113  . atenolol (TENORMIN) tablet 50 mg  50 mg Oral Daily Cammy CopaMaier, Angela, MD   50 mg at 11/21/17 1136  . benzonatate (TESSALON) capsule 100 mg  100 mg Oral Q6H PRN Cammy CopaMaier, Angela, MD      . bisacodyl (DULCOLAX) EC tablet 5 mg  5 mg Oral Daily PRN Cammy CopaMaier, Angela, MD      . ceFEPIme (MAXIPIME) 2 g in sodium chloride 0.9 % 100 mL IVPB  2 g Intravenous Q12H Cammy CopaMaier, Angela, MD   Stopped at 11/21/17 2143  . cholecalciferol (VITAMIN D) tablet 2,000 Units  2,000 Units Oral Daily Cammy CopaMaier, Angela, MD   2,000 Units at 11/21/17 1137  . diltiazem (CARDIZEM CD) 24 hr capsule 120 mg  120 mg Oral Daily Altamese DillingVachhani, Vaibhavkumar, MD   120 mg at 11/21/17 1138  . docusate sodium (COLACE) capsule 100 mg  100 mg  Oral BID Cammy CopaMaier, Angela, MD   100 mg at 11/21/17 2113  . donepezil (ARICEPT) tablet 10 mg  10 mg Oral QHS Cammy CopaMaier, Angela, MD   10 mg at 11/21/17 2114  . guaiFENesin (MUCINEX) 12 hr tablet 600 mg  600 mg Oral BID Cammy CopaMaier, Angela, MD   600 mg at 11/21/17 2113  . HYDROcodone-acetaminophen (NORCO/VICODIN) 5-325 MG per tablet 1-2 tablet  1-2 tablet Oral Q4H PRN Cammy CopaMaier, Angela, MD      . ipratropium-albuterol (DUONEB) 0.5-2.5 (3) MG/3ML nebulizer solution 3 mL  3 mL Nebulization Q6H Cammy CopaMaier, Angela, MD   3 mL at 11/22/17 0803  . lisinopril (PRINIVIL,ZESTRIL) tablet 10 mg  10 mg Oral Daily Altamese DillingVachhani, Vaibhavkumar, MD   10 mg at 11/21/17 1136  . ondansetron (ZOFRAN) tablet 4 mg   4 mg Oral Q6H PRN Cammy CopaMaier, Angela, MD       Or  . ondansetron [facility](ZOFRAN) injection 4 mg  4 mg Intravenous Q6H PRN Cammy CopaMaier, Angela, MD      . QUEtiapine (SEROQUEL) tablet 12.5 mg  12.5 mg Oral BID Cammy CopaMaier, Angela, MD   12.5 mg at 11/21/17 2113  . sertraline (ZOLOFT) tablet 25 mg  25 mg Oral Epifanio LeschesQODAY Maier, Angela, MD   25 mg at 11/21/17 1137  . traMADol (ULTRAM) tablet 25 mg  25 mg Oral Daily Cammy CopaMaier, Angela, MD   25 mg at 11/21/17 1137  . traZODone (DESYREL) tablet 25 mg  25 mg Oral QHS PRN Cammy CopaMaier, Angela, MD      . vitamin B-12 (CYANOCOBALAMIN) tablet 250 mcg  250 mcg Oral Daily Cammy CopaMaier, Angela, MD   250 mcg at 11/21/17 1136     Discharge Medications: Please see discharge summary for a list of discharge medications.  Relevant Imaging Results:  Relevant Lab Results:   Additional Information SSN [id number]  Ruthe Mannanandace  Ronon Ferger, [place]

## 2017-11-23 LAB — CULTURE, BLOOD (ROUTINE X 2)
Culture: NO GROWTH
Culture: NO GROWTH
Special Requests: ADEQUATE
Special Requests: ADEQUATE

## 2017-12-22 ENCOUNTER — Other Ambulatory Visit: Payer: Self-pay

## 2017-12-22 ENCOUNTER — Inpatient Hospital Stay
Admission: EM | Admit: 2017-12-22 | Discharge: 2017-12-29 | DRG: 872 | Disposition: A | Discharge status: Skilled Nursing Facility | Payer: Medicare Other | Attending: Internal Medicine | Admitting: Internal Medicine

## 2017-12-22 ENCOUNTER — Encounter: Payer: Self-pay | Admitting: Emergency Medicine

## 2017-12-22 ENCOUNTER — Emergency Department: Payer: Medicare Other

## 2017-12-22 DIAGNOSIS — L89151 Pressure ulcer of sacral region, stage 1: Secondary | ICD-10-CM | POA: Diagnosis present

## 2017-12-22 DIAGNOSIS — I10 Essential (primary) hypertension: Secondary | ICD-10-CM | POA: Diagnosis present

## 2017-12-22 DIAGNOSIS — Z7952 Long term (current) use of systemic steroids: Secondary | ICD-10-CM

## 2017-12-22 DIAGNOSIS — A4159 Other Gram-negative sepsis: Secondary | ICD-10-CM | POA: Diagnosis not present

## 2017-12-22 DIAGNOSIS — Z79891 Long term (current) use of opiate analgesic: Secondary | ICD-10-CM | POA: Diagnosis not present

## 2017-12-22 DIAGNOSIS — A419 Sepsis, unspecified organism: Secondary | ICD-10-CM

## 2017-12-22 DIAGNOSIS — R21 Rash and other nonspecific skin eruption: Secondary | ICD-10-CM | POA: Diagnosis present

## 2017-12-22 DIAGNOSIS — F419 Anxiety disorder, unspecified: Secondary | ICD-10-CM | POA: Diagnosis present

## 2017-12-22 DIAGNOSIS — E876 Hypokalemia: Secondary | ICD-10-CM | POA: Diagnosis present

## 2017-12-22 DIAGNOSIS — Z7901 Long term (current) use of anticoagulants: Secondary | ICD-10-CM | POA: Diagnosis not present

## 2017-12-22 DIAGNOSIS — Z66 Do not resuscitate: Secondary | ICD-10-CM | POA: Diagnosis present

## 2017-12-22 DIAGNOSIS — F0390 Unspecified dementia without behavioral disturbance: Secondary | ICD-10-CM | POA: Diagnosis present

## 2017-12-22 DIAGNOSIS — I482 Chronic atrial fibrillation: Secondary | ICD-10-CM | POA: Diagnosis present

## 2017-12-22 DIAGNOSIS — Z79899 Other long term (current) drug therapy: Secondary | ICD-10-CM

## 2017-12-22 DIAGNOSIS — Z90710 Acquired absence of both cervix and uterus: Secondary | ICD-10-CM | POA: Diagnosis not present

## 2017-12-22 DIAGNOSIS — F329 Major depressive disorder, single episode, unspecified: Secondary | ICD-10-CM | POA: Diagnosis present

## 2017-12-22 DIAGNOSIS — Z820 Family history of epilepsy and other diseases of the nervous system: Secondary | ICD-10-CM

## 2017-12-22 DIAGNOSIS — Z91013 Allergy to seafood: Secondary | ICD-10-CM

## 2017-12-22 DIAGNOSIS — N390 Urinary tract infection, site not specified: Secondary | ICD-10-CM | POA: Diagnosis present

## 2017-12-22 DIAGNOSIS — A4150 Gram-negative sepsis, unspecified: Secondary | ICD-10-CM | POA: Diagnosis present

## 2017-12-22 DIAGNOSIS — Z8673 Personal history of transient ischemic attack (TIA), and cerebral infarction without residual deficits: Secondary | ICD-10-CM | POA: Diagnosis not present

## 2017-12-22 DIAGNOSIS — A0472 Enterocolitis due to Clostridium difficile, not specified as recurrent: Secondary | ICD-10-CM

## 2017-12-22 DIAGNOSIS — L8990 Pressure ulcer of unspecified site, unspecified stage: Secondary | ICD-10-CM

## 2017-12-22 LAB — URINALYSIS, COMPLETE (UACMP) WITH MICROSCOPIC
Bilirubin Urine: NEGATIVE
GLUCOSE, UA: NEGATIVE mg/dL
Hgb urine dipstick: NEGATIVE
Ketones, ur: NEGATIVE mg/dL
NITRITE: POSITIVE — AB
PH: 5 (ref 5.0–8.0)
PROTEIN: 30 mg/dL — AB
Specific Gravity, Urine: 1.029 (ref 1.005–1.030)
WBC, UA: >50 WBC/hpf — ABNORMAL HIGH (ref 0–5)

## 2017-12-22 LAB — CBC WITH DIFFERENTIAL/PLATELET
BASOS ABS: 0 10*3/uL (ref 0–0.1)
BASOS PCT: 0 %
EOS ABS: 0.1 10*3/uL (ref 0–0.7)
Eosinophils Relative: 0 %
HEMATOCRIT: 47 % (ref 35.0–47.0)
Hemoglobin: 16.5 g/dL — ABNORMAL HIGH (ref 12.0–16.0)
Lymphocytes Relative: 6 %
Lymphs Abs: 1.9 10*3/uL (ref 1.0–3.6)
MCH: 32.6 pg (ref 26.0–34.0)
MCHC: 35.1 g/dL (ref 32.0–36.0)
MCV: 92.9 fL (ref 80.0–100.0)
MONOS PCT: 6 %
Monocytes Absolute: 2 10*3/uL — ABNORMAL HIGH (ref 0.2–0.9)
NEUTROS ABS: 28.4 10*3/uL — AB (ref 1.4–6.5)
Neutrophils Relative %: 88 %
Platelets: 222 10*3/uL (ref 150–440)
RBC: 5.06 MIL/uL (ref 3.80–5.20)
RDW: 13.6 % (ref 11.5–14.5)
WBC: 32.3 10*3/uL — ABNORMAL HIGH (ref 3.6–11.0)

## 2017-12-22 LAB — COMPREHENSIVE METABOLIC PANEL
ALT: 32 U/L (ref 0–44)
ANION GAP: 10 (ref 5–15)
AST: 30 U/L (ref 15–41)
Albumin: 2.4 g/dL — ABNORMAL LOW (ref 3.5–5.0)
Alkaline Phosphatase: 68 U/L (ref 38–126)
BUN: 27 mg/dL — ABNORMAL HIGH (ref 8–23)
CALCIUM: 8.2 mg/dL — AB (ref 8.9–10.3)
CHLORIDE: 100 mmol/L (ref 98–111)
CO2: 25 mmol/L (ref 22–32)
Creatinine, Ser: 0.56 mg/dL (ref 0.44–1.00)
GFR calc non Af Amer: >60 mL/min (ref >60)
Glucose, Bld: 115 mg/dL — ABNORMAL HIGH (ref 70–99)
Potassium: 3.9 mmol/L (ref 3.5–5.1)
SODIUM: 135 mmol/L (ref 135–145)
Total Bilirubin: 1.1 mg/dL (ref 0.3–1.2)
Total Protein: 5.4 g/dL — ABNORMAL LOW (ref 6.5–8.1)

## 2017-12-22 LAB — C DIFFICILE QUICK SCREEN W PCR REFLEX
C DIFFICILE (CDIFF) TOXIN: POSITIVE — AB
C DIFFICLE (CDIFF) ANTIGEN: POSITIVE — AB
C Diff interpretation: DETECTED

## 2017-12-22 LAB — LACTIC ACID, PLASMA
LACTIC ACID, VENOUS: 1.6 mmol/L (ref 0.5–1.9)
Lactic Acid, Venous: 2.1 mmol/L (ref 0.5–1.9)

## 2017-12-22 MED ORDER — SODIUM CHLORIDE 0.9 % IV SOLN
1.0000 g | Freq: Once | INTRAVENOUS | Status: AC
  Administered 2017-12-22: 1 g via INTRAVENOUS
  Filled 2017-12-22: qty 1

## 2017-12-22 MED ORDER — ACETAMINOPHEN 650 MG RE SUPP: 650.0000 mg | Freq: Four times a day (QID) | RECTAL | Status: DC | PRN

## 2017-12-22 MED ORDER — APIXABAN 5 MG PO TABS
5.0000 mg | ORAL_TABLET | Freq: Two times a day (BID) | ORAL | Status: DC
  Administered 2017-12-22 – 2017-12-29 (×14): 5 mg via ORAL
  Filled 2017-12-22 (×14): qty 1

## 2017-12-22 MED ORDER — QUETIAPINE FUMARATE 25 MG PO TABS
12.5000 mg | ORAL_TABLET | Freq: Two times a day (BID) | ORAL | Status: DC
  Administered 2017-12-22 – 2017-12-29 (×14): 12.5 mg via ORAL
  Filled 2017-12-22 (×14): qty 1

## 2017-12-22 MED ORDER — METRONIDAZOLE 500 MG PO TABS
500.0000 mg | ORAL_TABLET | Freq: Once | ORAL | Status: AC
  Administered 2017-12-22: 500 mg via ORAL
  Filled 2017-12-22: qty 1

## 2017-12-22 MED ORDER — LISINOPRIL 10 MG PO TABS
10.0000 mg | ORAL_TABLET | Freq: Every day | ORAL | Status: DC
  Administered 2017-12-24 – 2017-12-29 (×6): 10 mg via ORAL
  Filled 2017-12-22 (×7): qty 1

## 2017-12-22 MED ORDER — ATENOLOL 50 MG PO TABS
50.0000 mg | ORAL_TABLET | Freq: Every day | ORAL | Status: DC
  Administered 2017-12-22 – 2017-12-29 (×7): 50 mg via ORAL
  Filled 2017-12-22 (×8): qty 1

## 2017-12-22 MED ORDER — SODIUM CHLORIDE 0.9 % IV SOLN
1.0000 g | INTRAVENOUS | Status: DC
  Administered 2017-12-23 – 2017-12-24 (×2): 1 g via INTRAVENOUS
  Filled 2017-12-22: qty 1
  Filled 2017-12-22: qty 10

## 2017-12-22 MED ORDER — ONDANSETRON HCL 4 MG PO TABS: 4.0000 mg | ORAL_TABLET | Freq: Four times a day (QID) | ORAL | Status: DC | PRN

## 2017-12-22 MED ORDER — SERTRALINE HCL 50 MG PO TABS
25.0000 mg | ORAL_TABLET | ORAL | Status: DC
  Administered 2017-12-22 – 2017-12-28 (×4): 25 mg via ORAL
  Filled 2017-12-22 (×4): qty 1

## 2017-12-22 MED ORDER — ALBUTEROL SULFATE (2.5 MG/3ML) 0.083% IN NEBU: 2.5000 mg | INHALATION_SOLUTION | RESPIRATORY_TRACT | Status: DC | PRN

## 2017-12-22 MED ORDER — VANCOMYCIN HCL IN DEXTROSE 1-5 GM/200ML-% IV SOLN
1000.0000 mg | Freq: Once | INTRAVENOUS | Status: AC
  Administered 2017-12-22: 1000 mg via INTRAVENOUS
  Filled 2017-12-22: qty 200

## 2017-12-22 MED ORDER — VANCOMYCIN 50 MG/ML ORAL SOLUTION
125.0000 mg | Freq: Four times a day (QID) | ORAL | Status: DC
  Administered 2017-12-22 – 2017-12-29 (×28): 125 mg via ORAL
  Filled 2017-12-22 (×32): qty 2.5

## 2017-12-22 MED ORDER — DILTIAZEM HCL ER COATED BEADS 120 MG PO CP24
120.0000 mg | ORAL_CAPSULE | Freq: Every day | ORAL | Status: DC
  Administered 2017-12-22 – 2017-12-29 (×7): 120 mg via ORAL
  Filled 2017-12-22 (×8): qty 1

## 2017-12-22 MED ORDER — ONDANSETRON HCL 4 MG/2ML IJ SOLN: 4.0000 mg | Freq: Four times a day (QID) | INTRAMUSCULAR | Status: DC | PRN

## 2017-12-22 MED ORDER — VITAMIN D 1000 UNITS PO TABS
2000.0000 [IU] | ORAL_TABLET | Freq: Every day | ORAL | Status: DC
  Administered 2017-12-23 – 2017-12-29 (×7): 2000 [IU] via ORAL
  Filled 2017-12-22 (×7): qty 2

## 2017-12-22 MED ORDER — SODIUM CHLORIDE 0.9 % IV BOLUS
1000.0000 mL | Freq: Once | INTRAVENOUS | Status: AC
  Administered 2017-12-22: 1000 mL via INTRAVENOUS

## 2017-12-22 MED ORDER — DONEPEZIL HCL 5 MG PO TABS
10.0000 mg | ORAL_TABLET | Freq: Every day | ORAL | Status: DC
  Administered 2017-12-22 – 2017-12-28 (×7): 10 mg via ORAL
  Filled 2017-12-22 (×8): qty 2

## 2017-12-22 MED ORDER — VITAMIN B-12 100 MCG PO TABS
250.0000 ug | ORAL_TABLET | Freq: Every day | ORAL | Status: DC
  Administered 2017-12-22 – 2017-12-29 (×8): 250 ug via ORAL
  Filled 2017-12-22 (×9): qty 3

## 2017-12-22 MED ORDER — TRAMADOL HCL 50 MG PO TABS
25.0000 mg | ORAL_TABLET | Freq: Three times a day (TID) | ORAL | Status: DC | PRN
  Administered 2017-12-24 – 2017-12-26 (×4): 25 mg via ORAL
  Filled 2017-12-22 (×4): qty 1

## 2017-12-22 MED ORDER — SODIUM CHLORIDE 0.9 % IV SOLN
INTRAVENOUS | Status: DC
  Administered 2017-12-22 – 2017-12-23 (×3): via INTRAVENOUS

## 2017-12-22 MED ORDER — HYDROCERIN EX CREA
1.0000 "application " | TOPICAL_CREAM | Freq: Every day | CUTANEOUS | Status: DC
  Administered 2017-12-22 – 2017-12-23 (×2): 1 via TOPICAL
  Filled 2017-12-22: qty 113

## 2017-12-22 MED ORDER — ACETAMINOPHEN 325 MG PO TABS: 650.0000 mg | ORAL_TABLET | Freq: Four times a day (QID) | ORAL | Status: DC | PRN

## 2017-12-22 NOTE — ED Notes (Signed)
Dr v notified of lactic acid results

## 2017-12-22 NOTE — ED Triage Notes (Addendum)
[facility] to ED by EMS [facility] where staff states pt developed a rash upon returning from rehab at [facility] for Pneumonia. Per family pt is also weaker than normal. PCP called prior to arrival and stated has concern for possible Cdiff. Stool sample obtained upon arrival.

## 2017-12-22 NOTE — ED Notes (Signed)
Pt has diarrhea as  Well

## 2017-12-22 NOTE — ED Notes (Signed)
Spoke with Terri in lab will add urine culture

## 2017-12-22 NOTE — Progress Notes (Signed)
CODE SEPSIS - PHARMACY COMMUNICATION  **Broad Spectrum Antibiotics should be administered within 1 hour of Sepsis diagnosis**  Time Code Sepsis Called/Page Received: 12/22/17 1542  Antibiotics Ordered: Cefepime, Vanc  Time of 1st antibiotic administration: 1652  Additional action taken by pharmacy: Called RN @ [phone] RE: abx-     RN was in room getting ready to hang abx.  If necessary, Name of Provider/Nurse Contacted: Ladoris Geneony    Ricard Faulkner A ,PharmD Clinical Pharmacist  12/22/2017  4:56 PM

## 2017-12-22 NOTE — ED Notes (Signed)
Pt weight 84.7 kg per bed scales

## 2017-12-22 NOTE — Progress Notes (Signed)
Family Meeting Note  Advance Directive:yes  Today a meeting took place with the Patient.  Patient is able to participate.   The following clinical team members were present during this meeting:MD  The following were discussed:Patient's diagnosis: , Patient's progosis: Unable to determine and Goals for treatment: DNR  Additional follow-up to be provided: needs to be further discussed with family.  Time spent during discussion:20 minutes  Dominique SinclairKaty D Mayo, MD

## 2017-12-22 NOTE — ED Notes (Signed)
Pt remains awake and alert pt is a  Difficult stick  And lab  In as well  As the  Iv therapist for a second  Iv access

## 2017-12-22 NOTE — ED Notes (Signed)
1635 Dr V notified  Of  c diff results

## 2017-12-22 NOTE — ED Provider Notes (Signed)
[facility] Emergency Department Provider Note  ____________________________________________  Time seen: Approximately 1:08 PM  I have reviewed the triage vital signs and the nursing notes.   HISTORY  Chief Complaint Rash and Weakness  Level 5 caveat:  Portions of the history and physical were unable to be obtained due to dementia   HPI Dominique Patel is a 83 y.o. female with a history of dementia, atrial fibrillation, hypertension who presents from [place] for evaluation of generalized weakness and diffuse rash.  Patient was admitted here a month ago for HCAP and new afib. She went to rehab after discharged and today was sent back to [facility]. Upon arrival, patient was noted to have a diffuse rash, a new decubitus ulcer, and patient was very weak which prompted return to the ER. Patient is not sure how long the rashes been there.  She reports that the rash is not painful or pruritic.  Patient is complaining of feeling very weak and tired.  She denies chest pain or shortness of breath abdominal pain or vomiting.  Patient has had diarrhea since arriving in [place].  No known prior history of C. difficile.   Past Medical History:  Diagnosis Date  . Anxiety disorder   . Cataracts, bilateral   . Dementia   . Hypertension   . Major depressive disorder     Patient Active Problem List   Diagnosis Date Noted  . Sepsis ([place]) 11/18/2017  . HCAP (healthcare-associated pneumonia) 11/27/2015  . HTN (hypertension) 11/27/2015  . New onset atrial fibrillation ([place]) 11/27/2015  . Major depressive disorder 08/20/2015  . Dementia with psychosis 08/20/2015    Past Surgical History:  Procedure Laterality Date  . ABDOMINAL HYSTERECTOMY      Prior to Admission medications   Medication Sig Start Date End Date Taking? Authorizing Provider  acetaminophen (TYLENOL) 650 MG CR tablet Take 650 mg by mouth 2 (two) times daily.   Yes [provider]    albuterol (PROVENTIL HFA;VENTOLIN HFA) 108 (90 Base) MCG/ACT inhaler Inhale 2 puffs into the lungs every 4 (four) hours as needed for wheezing or shortness of breath (or cough). 08/12/17  Yes Eula Listen, MD  apixaban (ELIQUIS) 5 MG TABS tablet Take 1 tablet (5 mg total) by mouth 2 (two) times daily. 11/29/15  Yes Gladstone Lighter, MD  atenolol (TENORMIN) 50 MG tablet Take 1 tablet (50 mg total) by mouth daily. 11/29/15  Yes Gladstone Lighter, MD  benzonatate (TESSALON PERLES) 100 MG capsule Take 1 capsule (100 mg total) by mouth every 6 (six) hours as needed for cough. 08/12/17  Yes Eula Listen, MD  cholecalciferol (VITAMIN D) 1000 units tablet Take 2,000 Units by mouth daily.   Yes [provider]  diltiazem (CARDIZEM CD) 120 MG 24 hr capsule Take 1 capsule (120 mg total) by mouth daily. 11/22/17  Yes Vaughan Basta, MD  donepezil (ARICEPT) 10 MG tablet Take 1 tablet by mouth at bedtime.  07/06/15  Yes [provider]  lisinopril (PRINIVIL,ZESTRIL) 10 MG tablet Take 1 tablet (10 mg total) by mouth daily. 11/22/17  Yes Vaughan Basta, MD  loperamide (IMODIUM) 2 MG capsule Take 2 mg by mouth every 8 (eight) hours as needed for diarrhea or loose stools.   Yes [provider]  predniSONE (DELTASONE) 10 MG tablet Take 10 mg by mouth 2 (two) times daily. 12/21/17 12/24/17 Yes [provider]  QUEtiapine (SEROQUEL) 25 MG tablet Take 12.5 mg by mouth 2 (two) times daily.  Yes [provider]  sertraline (ZOLOFT) 25 MG tablet Take 1 tablet by mouth every other day.    Yes [provider]  traMADol (ULTRAM) 50 MG tablet Take 0.5 tablets (25 mg total) by mouth daily. And 25MG  by mouth every 8 hours as needed for pain 11/22/17  Yes Vaughan Basta, MD  vitamin B-12 (CYANOCOBALAMIN) 250 MCG tablet Take 250 mcg by mouth daily.    Yes [provider]  predniSONE (STERAPRED UNI-PAK 21 TAB) 10 MG (21) TBPK tablet  Take 6 tabs first day, 5 tab on day 2, then 4 on day 3rd, 3 tabs on day 4th , 2 tab on day 5th, and 1 tab on 6th day. Patient not taking: Reported on 12/22/2017 11/21/17   Vaughan Basta, MD  Spacer/Aero-Holding Chambers DEVI 1 puff by Does not apply route every 4 (four) hours as needed. 08/12/17   Eula Listen, MD    Allergies Patient has no known allergies.  Family History  Problem Relation Age of Onset  . Alzheimer's disease Brother     Social History Social History   Tobacco Use  . Smoking status: Never Smoker  . Smokeless tobacco: Never Used  Substance Use Topics  . Alcohol use: No  . Drug use: No    Review of Systems  Constitutional: Negative for fever. + generalized weakness Cardiovascular: Negative for chest pain. Respiratory: Negative for shortness of breath. Gastrointestinal: Negative for abdominal pain, vomiting. +diarrhea. Genitourinary: Negative for dysuria. Musculoskeletal: Negative for back pain. Skin: + rash and decubitus ulcer Neurological: Negative for headaches, weakness or numbness. Psych: No SI or HI  ____________________________________________   PHYSICAL EXAM:  VITAL SIGNS: ED Triage Vitals [12/22/17 1257]  Enc Vitals Group     BP (!) 151/93     Pulse Rate 90     Resp 16     Temp 98.2 F (36.8 C)     Temp Source Oral     SpO2 95 %     Weight      Height      Head Circumference      Peak Flow      Pain Score      Pain Loc      Pain Edu?      Excl. in [place]?     Constitutional: Alert and oriented x 2, looks tired but in no distress.  HEENT:      Head: Normocephalic and atraumatic.         Eyes: Conjunctivae are normal. Sclera is non-icteric.       Mouth/Throat: Mucous membranes are moist. No intra-oral lesions      Neck: Supple with no signs of meningismus. Cardiovascular: Regular rate and rhythm. No murmurs, gallops, or rubs. 2+ symmetrical distal pulses are present in all extremities. No JVD. Respiratory: Normal  respiratory effort. Lungs are clear to auscultation bilaterally. No wheezes, crackles, or rhonchi.  Gastrointestinal: Soft, non tender, and non distended with positive bowel sounds. No rebound or guarding. Genitourinary: There is a 0.5cm stage I decubitus ulcer with no overlying cellulitis, watery green diarrhea on diaper Musculoskeletal: No edema, cyanosis, or erythema of extremities. Neurologic: Normal speech and language. Face is symmetric. Moving all extremities. No gross focal neurologic deficits are appreciated. Skin: Skin is warm, dry and intact. Diffuse erythematous, blanching, papular rash worse in the torso and back but also involving proximal thighs, neck, and face. Some circular lesions Psychiatric: Mood and affect are normal. Speech and behavior are normal.  ____________________________________________  LABS (all labs ordered are listed, but only abnormal results are displayed)  Labs Reviewed  C DIFFICILE QUICK SCREEN W PCR REFLEX - Abnormal; Notable for the following components:      Result Value   C Diff antigen POSITIVE (*)    C Diff toxin POSITIVE (*)    All other components within normal limits  CBC WITH DIFFERENTIAL/PLATELET - Abnormal; Notable for the following components:   WBC 32.3 (*)    Hemoglobin 16.5 (*)    Neutro Abs 28.4 (*)    Monocytes Absolute 2.0 (*)    All other components within normal limits  URINALYSIS, COMPLETE (UACMP) WITH MICROSCOPIC - Abnormal; Notable for the following components:   Color, Urine AMBER (*)    APPearance CLOUDY (*)    Protein, ur 30 (*)    Nitrite POSITIVE (*)    Leukocytes, UA MODERATE (*)    WBC, UA >50 (*)    Bacteria, UA MANY (*)    All other components within normal limits  COMPREHENSIVE METABOLIC PANEL - Abnormal; Notable for the following components:   Glucose, Bld 115 (*)    BUN 27 (*)    Calcium 8.2 (*)    Total Protein 5.4 (*)    Albumin 2.4 (*)    All other components within normal limits  CULTURE, BLOOD  (ROUTINE X 2)  CULTURE, BLOOD (ROUTINE X 2)  URINE CULTURE  LACTIC ACID, PLASMA  LACTIC ACID, PLASMA   ____________________________________________  EKG  ED ECG REPORT I, Rudene Re, the attending physician, personally viewed and interpreted this ECG.  Afib, rate 102, normal intervals, normal axis, inferior TWI, no STE.   ____________________________________________  RADIOLOGY  I have personally reviewed the images performed during this visit and I agree with the Radiologist's read.   Interpretation by Radiologist:  Dg Chest Portable 1 View  Result Date: 12/22/2017 CLINICAL DATA:  Sepsis EXAM: PORTABLE CHEST 1 VIEW COMPARISON:  11/18/2017 FINDINGS: There is mild bilateral chronic interstitial thickening. There is no focal consolidation. There is no pleural effusion or pneumothorax. The heart and mediastinal contours are unremarkable. The osseous structures are unremarkable. IMPRESSION: No active disease. Electronically Signed   By: Kathreen Devoid   On: 12/22/2017 15:55    ____________________________________________   PROCEDURES  Procedure(s) performed: None Procedures Critical Care performed: yes  CRITICAL CARE Performed by: Rudene Re  ?  Total critical care time: 40 min  Critical care time was exclusive of separately billable procedures and treating other patients.  Critical care was necessary to treat or prevent imminent or life-threatening deterioration.  Critical care was time spent personally by me on the following activities: development of treatment plan with patient and/or surrogate as well as nursing, discussions with consultants, evaluation of patient's response to treatment, examination of patient, obtaining history from patient or surrogate, ordering and performing treatments and interventions, ordering and review of laboratory studies, ordering and review of radiographic studies, pulse oximetry and re-evaluation of patient's  condition.  ____________________________________________   INITIAL IMPRESSION / ASSESSMENT AND PLAN / ED COURSE  83 y.o. female with a history of dementia, atrial fibrillation, hypertension who presents from [place] for evaluation of generalized weakness, diarrhea, and diffuse rash.   # rash: non pruritic/ non painful, no intra-oral lesions, diffuse - Ddx erythema multiforme, urticaria, drug eruption, SJS although no oral involvement.  # decubitus ulcer - small stage I, no evidence of overlying infection  # diarrhea - will check for C. Diff  # generalized weakness - possibly  multifactorial including deconditioning, will check for anemia, dehydration, UTI, electrolyte abnormalities.     _________________________ 4:35 PM on 12/22/2017 -----------------------------------------  Labs with a white count of 32K, positive UTI and positive C. difficile.  Patient meets sepsis criteria.  Patient was given cefepime, Vanco IV and Flagyl p.o.  Discussed with the hospitalist for admission.  As part of my medical decision making, I reviewed the following data within the electronic MEDICAL RECORD NUMBER Nursing notes reviewed and incorporated, Labs reviewed , EKG interpreted , Old EKG reviewed, Old chart reviewed, Radiograph reviewed , Discussed with admitting physician , Notes from prior ED visits and [place] Controlled Substance Database    Pertinent labs & imaging results that were available during my care of the patient were reviewed by me and considered in my medical decision making (see chart for details).    ____________________________________________   FINAL CLINICAL IMPRESSION(S) / ED DIAGNOSES  Final diagnoses:  Sepsis due to urinary tract infection (HCC)  Rash  Pressure injury of sacral region, stage 1  C. difficile diarrhea      NEW MEDICATIONS STARTED DURING THIS VISIT:  ED Discharge Orders    None       Note:  This document was prepared using Dragon voice recognition  software and may include unintentional dictation errors.    Don PerkingVeronese, [place], MD 12/22/17 1640

## 2017-12-22 NOTE — ED Notes (Signed)
Attempted x 4 to start IV and draw blood work.  On 4 th attempt, PIV initiated as per documentation.  Unable to draw blood back from PIV.  Phlebotomy called for blood draw.  Additional blood work ordered.  Phlebotomy called for blood work and IV team consult placed of 2nd IV.

## 2017-12-22 NOTE — H&P (Signed)
[facility] - [place] at [facility]   PATIENT NAME: Dominique Patel    MR#:  [id number]  DATE OF BIRTH:  [birth year]  DATE OF ADMISSION:  12/22/2017  PRIMARY CARE PHYSICIAN: System, Pcp Not In   REQUESTING/REFERRING PHYSICIAN: Nita Sickle, MD  CHIEF COMPLAINT:   Chief Complaint  Patient presents with  . Rash  . Weakness    HISTORY OF PRESENT ILLNESS:  Dominique Patel  is a 83 y.o. female with a known history of HTN, dementia, chronic atrial fibrillation (on eliquis), and depression/anxiety who presented to the ED from [facility] with diarrhea. She was recently hospitalized from 8/16-8/20 with sepsis secondary to HCAP. She was discharged to SNF and then discharged back to her ALF today. On arrival at her ALF, she was noted to have diarrhea, a diffuse rash, and weakness, so she was sent to the ED for further management. She states she does not know how long she has been having diarrhea for. She denies any nausea, vomiting, or abdominal pain. She endorses dysuria and urinary frequency. She denies suprapubic pain, fevers, or chills. She also does not know how long her rash has been present. She states that it is not itchy or painful. She does not know if it has been spreading.  In the ED, she was meeting sepsis criteria with WBC 32 and HR >90. UA was significant for moderate leukocytes, positive nitrites, and many bacteria. C. Diff was positive. She was given IV vancomycin, IV cefepime, and PO flagyl. Hospitalist team was called for admission.  PAST MEDICAL HISTORY:   Past Medical History:  Diagnosis Date  . Anxiety disorder   . Cataracts, bilateral   . Dementia   . Hypertension   . Major depressive disorder     PAST SURGICAL HISTORY:   Past Surgical History:  Procedure Laterality Date  . ABDOMINAL HYSTERECTOMY      SOCIAL HISTORY:   Social History   Tobacco Use  . Smoking status: Never Smoker  . Smokeless tobacco: Never Used  Substance Use  Topics  . Alcohol use: No    FAMILY HISTORY:   Family History  Problem Relation Age of Onset  . Alzheimer's disease Brother     DRUG ALLERGIES:  No Known Allergies  REVIEW OF SYSTEMS:   Review of Systems  Constitutional: Negative for chills and fever.  HENT: Positive for hearing loss. Negative for congestion and sore throat.   Eyes: Negative for blurred vision and double vision.  Respiratory: Negative for cough and shortness of breath.   Cardiovascular: Negative for chest pain, palpitations and leg swelling.  Gastrointestinal: Positive for diarrhea. Negative for abdominal pain, nausea and vomiting.  Genitourinary: Negative for dysuria and frequency.  Musculoskeletal: Positive for joint pain. Negative for back pain and neck pain.  Skin: Positive for rash. Negative for itching.  Neurological: Negative for dizziness and headaches.  Psychiatric/Behavioral: Negative for depression. The patient is not nervous/anxious.     MEDICATIONS AT HOME:   Prior to Admission medications   Medication Sig Start Date End Date Taking? Authorizing Provider  acetaminophen (TYLENOL) 650 MG CR tablet Take 650 mg by mouth 2 (two) times daily.   Yes [provider]  albuterol (PROVENTIL HFA;VENTOLIN HFA) 108 (90 Base) MCG/ACT inhaler Inhale 2 puffs into the lungs every 4 (four) hours as needed for wheezing or shortness of breath (or cough). 08/12/17  Yes Rockne Menghini, MD  apixaban (ELIQUIS) 5 MG TABS tablet Take 1 tablet (5 mg total) by  mouth 2 (two) times daily. 11/29/15  Yes Enid BaasKalisetti, Radhika, MD  atenolol (TENORMIN) 50 MG tablet Take 1 tablet (50 mg total) by mouth daily. 11/29/15  Yes Enid BaasKalisetti, Radhika, MD  benzonatate (TESSALON PERLES) 100 MG capsule Take 1 capsule (100 mg total) by mouth every 6 (six) hours as needed for cough. 08/12/17  Yes Rockne MenghiniNorman, Anne-Caroline, MD  cholecalciferol (VITAMIN D) 1000 units tablet Take 2,000 Units by mouth daily.   Yes [provider]    diltiazem (CARDIZEM CD) 120 MG 24 hr capsule Take 1 capsule (120 mg total) by mouth daily. 11/22/17  Yes Altamese DillingVachhani, Vaibhavkumar, MD  donepezil (ARICEPT) 10 MG tablet Take 1 tablet by mouth at bedtime.  07/06/15  Yes [provider]  lisinopril (PRINIVIL,ZESTRIL) 10 MG tablet Take 1 tablet (10 mg total) by mouth daily. 11/22/17  Yes Altamese DillingVachhani, Vaibhavkumar, MD  loperamide (IMODIUM) 2 MG capsule Take 2 mg by mouth every 8 (eight) hours as needed for diarrhea or loose stools.   Yes [provider]  predniSONE (DELTASONE) 10 MG tablet Take 10 mg by mouth 2 (two) times daily. 12/21/17 12/24/17 Yes [provider]  QUEtiapine (SEROQUEL) 25 MG tablet Take 12.5 mg by mouth 2 (two) times daily.    Yes [provider]  sertraline (ZOLOFT) 25 MG tablet Take 1 tablet by mouth every other day.    Yes [provider]  traMADol (ULTRAM) 50 MG tablet Take 0.5 tablets (25 mg total) by mouth daily. And 25MG  by mouth every 8 hours as needed for pain 11/22/17  Yes Altamese DillingVachhani, Vaibhavkumar, MD  vitamin B-12 (CYANOCOBALAMIN) 250 MCG tablet Take 250 mcg by mouth daily.    Yes [provider]  predniSONE (STERAPRED UNI-PAK 21 TAB) 10 MG (21) TBPK tablet Take 6 tabs first day, 5 tab on day 2, then 4 on day 3rd, 3 tabs on day 4th , 2 tab on day 5th, and 1 tab on 6th day. Patient not taking: Reported on 12/22/2017 11/21/17   Altamese DillingVachhani, Vaibhavkumar, MD  Spacer/Aero-Holding Chambers DEVI 1 puff by Does not apply route every 4 (four) hours as needed. 08/12/17   Rockne MenghiniNorman, Anne-Caroline, MD      VITAL SIGNS:  Blood pressure 132/85, pulse 96, temperature 98.2 F (36.8 C), temperature source Oral, resp. rate (!) 24, SpO2 98 %.  PHYSICAL EXAMINATION:  Physical Exam  GENERAL:  83 y.o.-year-old patient lying in the bed with no acute distress.  EYES:  Pinpoint pupils. No scleral icterus. Extraocular muscles intact.  HEENT: Head atraumatic, normocephalic. Oropharynx and nasopharynx clear.  Mildly dry mucous membranes. NECK:  Supple, no jugular venous distention. No thyroid enlargement, no tenderness.  LUNGS: Normal breath sounds bilaterally, no wheezing, rales,rhonchi or crepitation. No use of accessory muscles of respiration.  CARDIOVASCULAR: tachycardic, irregularly irregular rate, S1, S2 normal. No murmurs, rubs, or gallops.  ABDOMEN: +generalized "tenderness" to palpation. Soft and nondistended. Bowel sounds present. No organomegaly or mass.  EXTREMITIES: No pedal edema, cyanosis, or clubbing.  NEUROLOGIC: Cranial nerves II through XII are intact. +global weakness. Sensation intact. Gait not checked.  PSYCHIATRIC: The patient is alert and oriented to person and place. SKIN: +erythema and dry skin present on neck, chest, and arms; +erythematous coalescing rash present on the abdomen and upper thighs. 0.5cm stage I sacral decubitus ulcer present.  LABORATORY PANEL:   CBC Recent Labs  Lab 12/22/17 1504  WBC 32.3*  HGB 16.5*  HCT 47.0  PLT 222   ------------------------------------------------------------------------------------------------------------------  Chemistries  Recent Labs  Lab  12/22/17 1504  NA 135  K 3.9  CL 100  CO2 25  GLUCOSE 115*  BUN 27*  CREATININE 0.56  CALCIUM 8.2*  AST 30  ALT 32  ALKPHOS 68  BILITOT 1.1   ------------------------------------------------------------------------------------------------------------------  Cardiac Enzymes No results for input(s): TROPONINI in the last 168 hours. ------------------------------------------------------------------------------------------------------------------  RADIOLOGY:  Dg Chest Portable 1 View  Result Date: 12/22/2017 CLINICAL DATA:  Sepsis EXAM: PORTABLE CHEST 1 VIEW COMPARISON:  11/18/2017 FINDINGS: There is mild bilateral chronic interstitial thickening. There is no focal consolidation. There is no pleural effusion or pneumothorax. The heart and mediastinal contours are  unremarkable. The osseous structures are unremarkable. IMPRESSION: No active disease. Electronically Signed   By: Elige Ko   On: 12/22/2017 15:55      IMPRESSION AND PLAN:   Sepsis secondary to UTI- likely related to multiple episodes of diarrhea. WBC 32 and HR > 90 on arrival. Afebrile. UA with moderate leuks and positive nitrites. CXR negative. - s/p vancomycin and cefepime in the ED, will change to IV ceftriaxone (no resistant organisms on previous urine cultures) - check lactic acid - blood and urine cultures pending - s/p 1L NS bolus in the ED, will start on MIVFs - PT/OT consult  C. Diff colitis- tested positive in the ED. - oral vancomycin qid  Generalized rash- unclear etiology. No new meds that patient is aware of. Appearance not consistent with Stevens-Johnson syndrome and no oral lesions. Not consistent with staph scalded skin syndrome. - monitor - apply lotion for dry skin - consider derm consult  Stage I sacral decubitus ulcer- 0.5cm in diameter, no signs of surrounding infection - frequent turns - local wound care  Chronic atrial fibrillation- mildly tachycardic with HRs in the 90s. - continue home atenolol and diltiazem  Hypertension- normotensive in the ED - continue home atenolol and lisinopril  Depression- stable - continue home zoloft  Dementia- stable - continue home aricept and seroquel  All the records are reviewed and case discussed with ED provider. Management plans discussed with the patient, family and they are in agreement.  CODE STATUS: DNR  TOTAL TIME TAKING CARE OF THIS PATIENT: 40 minutes.    Jinny Blossom Dominique Patel M.D on 12/22/2017 at 5:00 PM  Between 7am to 6pm - Pager [phone]  After 6pm go to [email or web address] - Social research officer, government  [facility]  Office  [phone]  CC: Primary care physician; System, Pcp Not In   Note: This dictation was prepared with Dragon dictation along with smaller phrase  technology. Any transcriptional errors that result from this process are unintentional.

## 2017-12-22 NOTE — ED Notes (Signed)
Pt cleaned  And  Diaper changed

## 2017-12-22 NOTE — ED Notes (Signed)
Patient transported to room 131

## 2017-12-23 ENCOUNTER — Other Ambulatory Visit: Payer: Self-pay

## 2017-12-23 LAB — BASIC METABOLIC PANEL
Anion gap: 7 (ref 5–15)
BUN: 19 mg/dL (ref 8–23)
CHLORIDE: 106 mmol/L (ref 98–111)
CO2: 23 mmol/L (ref 22–32)
Calcium: 7.6 mg/dL — ABNORMAL LOW (ref 8.9–10.3)
Creatinine, Ser: 0.47 mg/dL (ref 0.44–1.00)
GFR calc Af Amer: >60 mL/min (ref >60)
GFR calc non Af Amer: >60 mL/min (ref >60)
GLUCOSE: 101 mg/dL — AB (ref 70–99)
POTASSIUM: 3.4 mmol/L — AB (ref 3.5–5.1)
Sodium: 136 mmol/L (ref 135–145)

## 2017-12-23 LAB — CBC
HEMATOCRIT: 43 % (ref 35.0–47.0)
Hemoglobin: 14.4 g/dL (ref 12.0–16.0)
MCH: 32.1 pg (ref 26.0–34.0)
MCHC: 33.4 g/dL (ref 32.0–36.0)
MCV: 96 fL (ref 80.0–100.0)
Platelets: 158 10*3/uL (ref 150–440)
RBC: 4.48 MIL/uL (ref 3.80–5.20)
RDW: 13.5 % (ref 11.5–14.5)
WBC: 19.1 10*3/uL — ABNORMAL HIGH (ref 3.6–11.0)

## 2017-12-23 LAB — MRSA PCR SCREENING: MRSA by PCR: NEGATIVE

## 2017-12-23 MED ORDER — ADULT MULTIVITAMIN W/MINERALS CH
1.0000 | ORAL_TABLET | Freq: Every day | ORAL | Status: DC
  Administered 2017-12-23 – 2017-12-29 (×7): 1 via ORAL
  Filled 2017-12-23 (×7): qty 1

## 2017-12-23 MED ORDER — JUVEN PO PACK
1.0000 | PACK | Freq: Two times a day (BID) | ORAL | Status: DC
  Administered 2017-12-23 – 2017-12-29 (×11): 1 via ORAL

## 2017-12-23 MED ORDER — GERHARDT'S BUTT CREAM
TOPICAL_CREAM | Freq: Two times a day (BID) | CUTANEOUS | Status: DC
  Administered 2017-12-23 – 2017-12-29 (×13): via TOPICAL
  Filled 2017-12-23 (×3): qty 1

## 2017-12-23 MED ORDER — POTASSIUM CHLORIDE CRYS ER 20 MEQ PO TBCR
40.0000 meq | EXTENDED_RELEASE_TABLET | Freq: Once | ORAL | Status: AC
  Administered 2017-12-23: 09:00:00 40 meq via ORAL
  Filled 2017-12-23: qty 2

## 2017-12-23 NOTE — [place] FL2 (Signed)
[place] MEDICAID FL2 LEVEL OF CARE SCREENING TOOL     IDENTIFICATION  Patient Name: Dominique Patel Birthdate: 07/08/1934 Sex: female Admission Date (Current Location): 12/22/2017  [place] and [place] Number:  Chiropodist and Address:  [facility], [address] [place], [place] [address]      Provider Number: [id number]  Attending Physician Name and Address:  Adrian Saran, MD  Relative Name and Phone Number:  Ames Coupe- nephew [phone]    Current Level of Care: Hospital Recommended Level of Care: Skilled Nursing Facility Prior Approval Number:    Date Approved/Denied:   PASRR Number:    Discharge Plan: SNF    Current Diagnoses: Patient Active Problem List   Diagnosis Date Noted  . Sepsis due to gram-negative UTI (HCC) 12/22/2017  . Sepsis (HCC) 11/18/2017  . HCAP (healthcare-associated pneumonia) 11/27/2015  . HTN (hypertension) 11/27/2015  . New onset atrial fibrillation (HCC) 11/27/2015  . Major depressive disorder 08/20/2015  . Dementia with psychosis 08/20/2015    Orientation RESPIRATION BLADDER Height & Weight     Self, Place  Normal Incontinent Weight: 179 lb 10.8 oz (81.5 kg) Height:  5\' 2"  (157.5 cm)  BEHAVIORAL SYMPTOMS/MOOD NEUROLOGICAL BOWEL NUTRITION STATUS  (none) (none) Incontinent Diet(Heart Healthy )  AMBULATORY STATUS COMMUNICATION OF NEEDS Skin   Extensive Assist Verbally Other (Comment)(Stage 2 on buttocks )                       Personal Care Assistance Level of Assistance  Bathing, Feeding, Dressing Bathing Assistance: Limited assistance Feeding assistance: Independent Dressing Assistance: Limited assistance     Functional Limitations Info  Sight, Hearing, Speech Sight Info: Adequate Hearing Info: Adequate Speech Info: Adequate    SPECIAL CARE FACTORS FREQUENCY  OT (By licensed OT)     PT Frequency: 5 OT Frequency: 5            Contractures Contractures Info: Not  present    Additional Factors Info  Code Status, Allergies Code Status Info: DNR Allergies Info: NKA           Current Medications (12/23/2017):  This is the current hospital active medication list Current Facility-Administered Medications  Medication Dose Route Frequency Provider Last Rate Last Dose  . 0.9 %  sodium chloride infusion   Intravenous Continuous Adrian Saran, MD 50 mL/hr at 12/23/17 0918    . acetaminophen (TYLENOL) tablet 650 mg  650 mg Oral Q6H PRN Mayo, Allyn Kenner, MD       Or  . acetaminophen (TYLENOL) suppository 650 mg  650 mg Rectal Q6H PRN Mayo, Allyn Kenner, MD      . albuterol (PROVENTIL) (2.5 MG/3ML) 0.083% nebulizer solution 2.5 mg  2.5 mg Inhalation Q4H PRN Mayo, Allyn Kenner, MD      . apixaban Everlene Balls) tablet 5 mg  5 mg Oral BID Mayo, Allyn Kenner, MD   5 mg at 12/23/17 0917  . atenolol (TENORMIN) tablet 50 mg  50 mg Oral Daily Mayo, Allyn Kenner, MD   50 mg at 12/22/17 2037  . cefTRIAXone (ROCEPHIN) 1 g in sodium chloride 0.9 % 100 mL IVPB  1 g Intravenous Q24H Campbell Stall, MD 200 mL/hr at 12/23/17 0916 1 g at 12/23/17 0916  . cholecalciferol (VITAMIN D) tablet 2,000 Units  2,000 Units Oral Daily Mayo, Allyn Kenner, MD   2,000 Units at 12/23/17 [phone]  . diltiazem (CARDIZEM CD) 24 hr capsule 120 mg  120 mg Oral  Daily Mayo, Allyn KennerKaty Dodd, MD   120 mg at 12/22/17 2037  . donepezil (ARICEPT) tablet 10 mg  10 mg Oral QHS Mayo, Allyn KennerKaty Dodd, MD   10 mg at 12/22/17 2202  . Gerhardt's butt cream   Topical BID Mody, Sital, MD      . lisinopril (PRINIVIL,ZESTRIL) tablet 10 mg  10 mg Oral Daily Mayo, Allyn KennerKaty Dodd, MD      . multivitamin with minerals tablet 1 tablet  1 tablet Oral Daily Adrian SaranMody, Sital, MD   1 tablet at 12/23/17 1605  . nutrition supplement (JUVEN) (JUVEN) powder packet 1 packet  1 packet Oral BID BM Adrian SaranMody, Sital, MD   1 packet at 12/23/17 1605  . ondansetron (ZOFRAN) tablet 4 mg  4 mg Oral Q6H PRN Mayo, Allyn KennerKaty Dodd, MD       Or  . ondansetron [facility](ZOFRAN) injection 4 mg  4 mg  Intravenous Q6H PRN Mayo, Allyn KennerKaty Dodd, MD      . QUEtiapine (SEROQUEL) tablet 12.5 mg  12.5 mg Oral BID Campbell StallMayo, Katy Dodd, MD   12.5 mg at 12/23/17 0917  . sertraline (ZOLOFT) tablet 25 mg  25 mg Oral QODAY Mayo, Allyn KennerKaty Dodd, MD   25 mg at 12/22/17 2248  . traMADol (ULTRAM) tablet 25 mg  25 mg Oral Q8H PRN Mayo, Allyn KennerKaty Dodd, MD      . vancomycin (VANCOCIN) 50 mg/mL oral solution 125 mg  125 mg Oral QID Campbell StallMayo, Katy Dodd, MD   125 mg at 12/23/17 1400  . vitamin B-12 (CYANOCOBALAMIN) tablet 250 mcg  250 mcg Oral Daily Mayo, Allyn KennerKaty Dodd, MD   250 mcg at 12/23/17 [id number]     Discharge Medications: Please see discharge summary for a list of discharge medications.  Relevant Imaging Results:  Relevant Lab Results:   Additional Information SSN: [id number]  Ruthe Mannanandace  Trevontae Lindahl, [place]

## 2017-12-23 NOTE — Progress Notes (Signed)
[employer] - [place] [facility]   PATIENT NAME: Dominique HeadsSally Patel    MR#:  [id number]  DATE OF BIRTH:  [birth year]  SUBJECTIVE:  Patient with dementia Per nurse 2 loose stools overnight   REVIEW OF SYSTEMS:    dementia   Tolerating Diet:yes      DRUG ALLERGIES:  No Known Allergies  VITALS:  Blood pressure 107/60, pulse 62, temperature 98.3 F (36.8 C), temperature source Oral, resp. rate 20, height 5\' 2"  (1.575 m), weight 81.5 kg, SpO2 97 %.  PHYSICAL EXAMINATION:  Constitutional: Appears well-developed and well-nourished. No distress. HENT: Normocephalic. Marland Kitchen. Oropharynx is clear and moist.  Eyes: Conjunctivae and EOM are normal. PERRLA, no scleral icterus.  Neck: Normal ROM. Neck supple. No JVD. No tracheal deviation. CVS: RRR, S1/S2 +, no murmurs, no gallops, no carotid bruit.  Pulmonary: Effort and breath sounds normal, no stridor, rhonchi, wheezes, rales.  Abdominal: Soft. BS +,  no distension, tenderness, rebound or guarding.  Musculoskeletal: Normal range of motion. No edema and no tenderness.  Neuro: Alert.  Oriented to name and place not time CN 2-12 grossly intact. No focal deficits. Skin: Skin is warm and dry.  She has some redness on her neck and chest as well as abdomen and upper thighs Stage I decubitus ulcer present on admission   psychiatric: Pleasantly demented .      LABORATORY PANEL:   CBC Recent Labs  Lab 12/23/17 0518  WBC 19.1*  HGB 14.4  HCT 43.0  PLT 158   ------------------------------------------------------------------------------------------------------------------  Chemistries  Recent Labs  Lab 12/22/17 1504 12/23/17 0518  NA 135 136  K 3.9 3.4*  CL 100 106  CO2 25 23  GLUCOSE 115* 101*  BUN 27* 19  CREATININE 0.56 0.47  CALCIUM 8.2* 7.6*  AST 30  --   ALT 32  --   ALKPHOS 68  --   BILITOT 1.1  --     ------------------------------------------------------------------------------------------------------------------  Cardiac Enzymes No results for input(s): TROPONINI in the last 168 hours. ------------------------------------------------------------------------------------------------------------------  RADIOLOGY:  Dg Chest Portable 1 View  Result Date: 12/22/2017 CLINICAL DATA:  Sepsis EXAM: PORTABLE CHEST 1 VIEW COMPARISON:  11/18/2017 FINDINGS: There is mild bilateral chronic interstitial thickening. There is no focal consolidation. There is no pleural effusion or pneumothorax. The heart and mediastinal contours are unremarkable. The osseous structures are unremarkable. IMPRESSION: No active disease. Electronically Signed   By: Elige KoHetal  Patel   On: 12/22/2017 15:55     ASSESSMENT AND PLAN:    83 year old female with history of dementia and chronic atrial fibrillation on anticoagulation who [facility] the [facility] with diarrhea. Patient was recently hospitalized and discharged on August 20 with sepsis due to H CAP.  1.  Sepsis: Patient presents with tachycardia and leukocytosis.  Sepsis is due to urinary tract infection in addition to C. difficile diarrhea Lactic acid is normalized and white blood cell count has improved 2.  C. difficile diarrhea: Continue vancomycin  3.  Urinary tract infection: Continue Rocephin and follow-up on urine culture  4.  Stage I sacral decubitus ulcer, present on admission: Wound care consult  5.  Chronic atrial fibrillation on patient currently normal sinus rhythm: She is on diltiazem and atenolol Blood pressure on lower side this morning and therefore medications will be held today Continue Eliquis  6.  Dementia: Continue Zoloft, Seroquel and donepezil 7.  Hypokalemia from diarrhea: Replace and recheck in a.m.   Physical therapy consultation  for discharge planning and clinical social work  consult  Management plans discussed withn ursing  CODE STATUS: DNR  TOTAL TIME TAKING CARE OF THIS PATIENT: 27 minutes.     POSSIBLE D/C 1-2 days to ALF [facility], DEPENDING ON CLINICAL CONDITION.   Harper Vandervoort M.D on 12/23/2017 at 9:43 AM  Between 7am to 6pm - Pager - [phone] After 6pm go to [email or web address] - password [email or web address]  [facility]  Office  [phone]  CC: Primary care physician; System, Pcp Not In  Note: This dictation was prepared with Dragon dictation along with smaller phrase technology. Any transcriptional errors that result from this process are unintentional.

## 2017-12-23 NOTE — Evaluation (Signed)
Occupational Therapy Evaluation Patient Details Name: Dominique LocketSally W Achille MRN: [id number] DOB: 12-Dec-1934 Today's Date: 12/23/2017    History of Present Illness Pt is a 83 y.o. female that was addmitted to the hospital for sepsis and c/c of SOB and cough. Pt PMH includes dementia, HTN, depression, anxiety, and A-Fib.  Pt was addmitted on 8/16 and plan was to d/c 8/19 which was cancled 2/2 increased weakness.   Clinical Impression   Pt is 83 year old female who [facility] with [facility] and rash and [facility].  She was hospitalized 8/16-8/20 due to HCAP.  She is pleasant and oriented to self and month only and is easily distracted by telling family stories and needs redirection to task.  She has a Stage 1 decubitus on sacral area.  She was feeding herself lunch but did not have a good appetite and was chewing on a piece of fruit and holding it in her cheeks.  She does not have any teeth and no dentures in place.  She stated [facility].  She was able to wash her face and feed self with spoon using R hand.  Mod assist and max cues needed for bed mobility with difficulty problem solving how to move hips forward sitting and unable to reach feet to complete LB dressing and required max assist for don/doffing slipper socks.  She fatigued after sitting for a few minutes and asked to lay back in bed with mod assist and cues.  Pt would benefit from skilled OT services to increase independence in ADLs and SNF after discharge before returning back [facility].    Follow Up Recommendations  SNF    Equipment Recommendations       Recommendations for Other Services       Precautions / Restrictions Precautions Precautions: Fall;Other (comment) Precaution Comments: enteric precautions and has rash all over body Restrictions Weight Bearing Restrictions: No      Mobility Bed Mobility                  Transfers                       Balance                                           ADL either performed or assessed with clinical judgement   ADL Overall ADL's : Needs assistance/impaired Eating/Feeding: Set up;Minimal assistance Eating/Feeding Details (indicate cue type and reason): Pt currently does not have her dentures in place and was chewing on fruit and pocketing in her mouth upon OT arrival and may benefit from soft diet until she has dentures back. Grooming: Wash/dry hands;Wash/dry face;Set up;Modified independent;Cueing for sequencing Grooming Details (indicate cue type and reason): cues to stay on task...distracted by telling family stories during ADL assessment         Upper Body Dressing : Set up;Minimal assistance                     General ADL Comments: Mod assist and cues for bed mobility to sit at EOB to assess LB dressing skills and balance sitting. She said she [facility] up in [facility] and needs help for bathing and dressing skills but has dementia and not clear if  this is accurate or not.  Pt is cooperative but easily distracted.     Vision Patient Visual Report: No change from baseline       Perception     Praxis      Pertinent Vitals/Pain Pain Assessment: No/denies pain     Hand Dominance Right   Extremity/Trunk Assessment Upper Extremity Assessment Upper Extremity Assessment: Generalized weakness   Lower Extremity Assessment Lower Extremity Assessment: Defer to PT evaluation       Communication Communication Communication: No difficulties   Cognition Arousal/Alertness: Awake/alert Behavior During Therapy: WFL for tasks assessed/performed Overall Cognitive Status: History of cognitive impairments - at baseline                                 General Comments: Pt with dementia and oriented to self and that she is not at [facility] memory Care    General Comments       Exercises     Shoulder  Instructions      Home Living Family/patient expects to be discharged to:: Assisted living                             Home Equipment: Dan Humphreys - 2 wheels   Additional Comments: [facility] care unit at [facility]      Prior Functioning/Environment Level of Independence: Needs assistance  Gait / Transfers Assistance Needed: Mod I with RW per chart from Aug 2019 ADL's / Homemaking Assistance Needed: Mod I with clothing, toielting, eating, but required assist with more clomplex IADLs like cooking and housekeeping per chart review from Aug 2019            OT Problem List: Decreased strength;Decreased activity tolerance;Decreased safety awareness;Impaired balance (sitting and/or standing)      OT Treatment/Interventions: Self-care/ADL training;Balance training;Patient/family education    OT Goals(Current goals can be found in the care plan section) Acute Rehab OT Goals Patient Stated Goal: to go back to [facility]  OT Goal Formulation: With patient Time For Goal Achievement: 01/06/18 Potential to Achieve Goals: Fair ADL Goals Pt Will Perform Upper Body Dressing: with set-up;with min guard assist;sitting Pt Will Perform Lower Body Dressing: with set-up;with min assist;sit to/from stand Pt Will Transfer to Toilet: with set-up;with min assist;stand pivot transfer;regular height toilet;grab bars  OT Frequency: Min 1X/week   Barriers to D/C:            Co-evaluation              AM-PAC PT "6 Clicks" Daily Activity     Outcome Measure Help from another person eating meals?: A Little Help from another person taking care of personal grooming?: None Help from another person toileting, which includes using toliet, bedpan, or urinal?: A Lot Help from another person bathing (including washing, rinsing, drying)?: A Lot Help from another person to put on and taking off regular upper body clothing?: A Little Help from another person to put on and taking off regular lower  body clothing?: A Lot 6 Click Score: 16   End of Session    Activity Tolerance: Patient tolerated treatment well Patient left: in bed;with call bell/phone within reach;with bed alarm set  OT Visit Diagnosis: Unsteadiness on feet (R26.81);Muscle weakness (generalized) (M62.81)                Time: 1610-9604 OT Time Calculation (min): 25 min Charges:  OT General Charges $OT Visit: 1 Visit OT Evaluation $OT Eval Low Complexity: 1 Low OT Treatments $Self Care/Home Management : 8-22 mins  Susanne Borders, OTR/L ascom [phone] 12/23/17, 1:49 PM

## 2017-12-23 NOTE — Clinical Social Work Note (Signed)
Clinical Social Work Assessment  Patient Details  Name: Dominique Patel MRN: [id number] Date of Birth: Mar 16, 1935  Date of referral:  12/23/17               Reason for consult:  Facility Placement                Permission sought to share information with:  Case Manager, Magazine features editoracility Contact Representative, Family Supports Permission granted to share information::  Yes, Verbal Permission Granted  Name::      SNF  Agency::   [place]   Relationship::     Contact Information:     Housing/Transportation Living arrangements for the past 2 months:  Assisted Living Facility Source of Information:  Other (Comment Required)(Nephew ) Patient Interpreter Needed:  None Criminal Activity/Legal Involvement Pertinent to Current Situation/Hospitalization:  No - Comment as needed Significant Relationships:  Other Family Members Lives with:  Facility Resident Do you feel safe going back to the place where you live?  Yes Need for family participation in patient care:  Yes (Comment)  Care giving concerns: Patient [facility]    Social Worker assessment / plan:  CSW consulted for facility placement. CSW attempted to meet with patient but she was sleeping. CSW contacted patient's nephew Dominique Patel [phone]. Dominique FastRonald states that patient is [facility] but has recently been at [facility] for rehab. CSW explained that PT has recommended SNF for patient. Nephew is in agreement but states that he does not want her to go back to [facility]. CSW will initiate a bed search. CSW also spoke with Dominique Patel at [facility] center. Dominique Patel states that patient was there from 11/22/17-12/21/17 under Medicare. CSW will follow for discharge planning.   Employment status:  Retired Health and safety inspectornsurance information:  [employer] PT Recommendations:  Skilled Teacher, early years/preursing Facility Information / Referral to community resources:  Skilled Nursing  Facility  Patient/Family's Response to care:  [employer]hew thanked CSW for assistance   Patient/Family's Understanding of and Emotional Response to Diagnosis, Current Treatment, and Prognosis:  Nephew is in agreement with plan   Emotional Assessment Appearance:  Appears stated age Attitude/Demeanor/Rapport:  Unable to Assess Affect (typically observed):  Unable to Assess Orientation:  Oriented to Self Alcohol / Substance use:  Not Applicable Psych involvement (Current and /or in the community):  No (Comment)  Discharge Needs  Concerns to be addressed:  Discharge Planning Concerns Readmission within the last 30 days:  Yes Current discharge risk:  None Barriers to Discharge:  Continued Medical Work up   Val[employer] EnergyCandace  Dominique Patel, LCSWA 12/23/2017, 4:04 PM

## 2017-12-23 NOTE — Evaluation (Signed)
Physical Therapy Evaluation Patient Details Name: Dominique Patel MRN: [id number] DOB: [birth year] Today's Date: 12/23/2017   History of Present Illness  presented to ER secondary to diarrhea, rash and generalized weakness; admitted with sepsis due to UTI.  Clinical Impression  Upon evaluation, patient alert and oriented to self only; follows simple commands, but requires increased time/encouragement to complete.  Globally weak and deconditioned throughout all extremities, though generally functional for basic transfers and mobility.  Currently requiring mod assist for bed mobility; mod assist +2 with RW for sit/stand and static standing balance.  Poor standing balance/endurance noted; spontaneously sitting after 5-10 seconds of stance each trial. Unsafe/unable to attempt gait or OOB at this time.  Will continue mobility assessment and progression as appropriate. Would benefit from skilled PT to address above deficits and promote optimal return to PLOF; recommend transition to STR upon discharge from acute hospitalization.     Follow Up Recommendations SNF    Equipment Recommendations       Recommendations for Other Services       Precautions / Restrictions Precautions Precautions: Fall;Other (comment) Precaution Comments: enteric precautions and has rash all over body Restrictions Weight Bearing Restrictions: No      Mobility  Bed Mobility Overal bed mobility: Needs Assistance Bed Mobility: Supine to Sit;Sit to Supine     Supine to sit: Min assist;Mod assist Sit to supine: Mod assist      Transfers Overall transfer level: Needs assistance Equipment used: Rolling walker (2 wheeled) Transfers: Sit to/from Stand Sit to Stand: Mod assist;+2 physical assistance         General transfer comment: extensive assist for lift off, postural extension and standing balance; poor activity tolerance, initiating spontaneous sit after 5-10 seconds each trial  Ambulation/Gait              General Gait Details: unsafe/unable  Stairs            Wheelchair Mobility    Modified Rankin (Stroke Patients Only)       Balance Overall balance assessment: Needs assistance Sitting-balance support: No upper extremity supported;Feet supported Sitting balance-Leahy Scale: Good     Standing balance support: Bilateral upper extremity supported Standing balance-Leahy Scale: Poor                               Pertinent Vitals/Pain Pain Assessment: No/denies pain    Home Living Family/patient expects to be discharged to:: [facility])               Home Equipment: Dan Humphreys - 2 wheels Additional Comments: Memory care unit at [facility]    Prior Function Level of Independence: Needs assistance   Gait / Transfers Assistance Needed: Mod I with RW per chart from Aug 2019  ADL's / Homemaking Assistance Needed: Mod I with clothing, toielting, eating, but required assist with more clomplex IADLs like cooking and housekeeping per chart review from Aug 2019  Comments: Pt is poor historian and unclear with level of assistance required in recent weeks     Hand Dominance   Dominant Hand: Right    Extremity/Trunk Assessment   Upper Extremity Assessment Upper Extremity Assessment: Generalized weakness(grossly 3-/5, shoulder elevation limited by chronic arthritic changes)    Lower Extremity Assessment Lower Extremity Assessment: Generalized weakness(grossly 4-/5 throughout)       Communication   Communication: No difficulties  Cognition Arousal/Alertness: Awake/alert Behavior During Therapy: [facility] for tasks assessed/performed  Overall Cognitive Status: History of cognitive impairments - at baseline                                 General Comments: Oriented to self only; reoriented/redirected to self, but limited carryover appreciated      General Comments      Exercises Other Exercises Other Exercises:  Sit/stand x3 with RW, mod assist +2 for lift off, postural extension and standing balance.  Increased sway in A/P plane; absent protective/righting reactions noted. Very high risk for falls.   Assessment/Plan    PT Assessment Patient needs continued PT services  PT Problem List Decreased strength;Decreased range of motion;Decreased activity tolerance;Decreased balance;Decreased mobility;Decreased coordination;Decreased cognition;Decreased knowledge of use of DME;Decreased safety awareness;Decreased knowledge of precautions;Cardiopulmonary status limiting activity;Obesity       PT Treatment Interventions DME instruction;Gait training;Functional mobility training;Therapeutic activities;Therapeutic exercise;Balance training;Patient/family education    PT Goals (Current goals can be found in the Care Plan section)  Acute Rehab PT Goals Patient Stated Goal: to [facility] back [facility]  PT Goal Formulation: With patient Time For Goal Achievement: 01/06/18 Potential to Achieve Goals: Fair    Frequency Min 2X/week   Barriers to discharge Decreased caregiver support      Co-evaluation               AM-PAC PT "6 Clicks" Daily Activity  Outcome Measure Difficulty turning over in bed (including adjusting bedclothes, sheets and blankets)?: Unable Difficulty moving from lying on back to sitting on the side of the bed? : Unable Difficulty sitting down on and standing up from a chair with arms (e.g., wheelchair, bedside commode, etc,.)?: Unable Help needed moving to and from a bed to chair (including a wheelchair)?: Total Help needed walking in hospital room?: Total Help needed climbing 3-5 steps with a railing? : Total 6 Click Score: 6    End of Session Equipment Utilized During Treatment: Gait belt Activity Tolerance: Patient limited by fatigue Patient left: in bed;with call bell/phone within reach;with bed alarm set Nurse Communication: Mobility status PT Visit Diagnosis:  Unsteadiness on feet (R26.81);Difficulty in walking, not elsewhere classified (R26.2);Muscle weakness (generalized) (M62.81)    Time: [id number] PT Time Calculation (min) (ACUTE ONLY): 25 min   Charges:   PT Evaluation $PT Eval Moderate Complexity: 1 Mod PT Treatments $Therapeutic Activity: 8-22 mins      \ Lovette Merta H. Manson PasseyBrown, PT, DPT, NCS 12/23/17, 3:01 PM [phone]

## 2017-12-23 NOTE — Progress Notes (Signed)
Initial Nutrition Assessment  DOCUMENTATION CODES:   Obesity unspecified  INTERVENTION:   -MVI with minerals daily -1 packet Juven BID, each packet provides 80 calories, 8 grams of carbohydrate, and 14 grams of amino acids; supplement contains CaHMB, glutamine, and arginine, to promote wound healing  NUTRITION DIAGNOSIS:   Increased nutrient needs related to wound healing as evidenced by estimated needs.  GOAL:   Patient will meet greater than or equal to 90% of their needs  MONITOR:   PO intake, Supplement acceptance, Labs, Weight trends, Skin, I & O's  REASON FOR ASSESSMENT:   Low Braden    ASSESSMENT:   Dominique Patel  is a 83 y.o. female with a known history of HTN, dementia, chronic atrial fibrillation (on eliquis), and depression/anxiety who [facility] with [facility]  Pt admitted with sepsis secondary to UTI.   PTA, [facility].  Spoke with pt at bedside, who had just awoken from a nap. She reports she has a great appetite and consumes all of her meals PTA. She also shares that she loves to participate in activities at SNF, especially those that involve food. Pt consumed about 25% of her lunch meal, but started eating it again during visit after RD directed her towards meal.  Pt denies any weight loss. Reviewed wt history. Wt typically ranges from 81-87 kg. Noted pt has experienced a 6.4% wt loss over the past 5 months, which is not significant for time frame.   Discussed with pt importance of good meal and supplement intake to promote healing.   Labs reviewed: K: 3.4.   NUTRITION - FOCUSED PHYSICAL EXAM:    Most Recent Value  Orbital Region  No depletion  Upper Arm Region  No depletion  Thoracic and Lumbar Region  No depletion  Buccal Region  No depletion  Temple Region  No depletion  Clavicle Bone Region  No depletion  Clavicle and Acromion Bone Region  No depletion  Scapular Bone Region   No depletion  Dorsal Hand  No depletion  Patellar Region  No depletion  Anterior Thigh Region  No depletion  Posterior Calf Region  No depletion  Edema (RD Assessment)  Mild  Hair  Reviewed  Eyes  Reviewed  Mouth  Reviewed  Skin  Reviewed  Nails  Reviewed       Diet Order:   Diet Order            Diet Heart Room service appropriate? Yes; Fluid consistency: Thin  Diet effective now              EDUCATION NEEDS:   Education needs have been addressed  Skin:  Skin Assessment: Skin Integrity Issues: Skin Integrity Issues:: Other (Comment), Stage II Stage II: rt/lt buttocks Other: MASD perineal area  Last BM:  12/23/17  Height:   Ht Readings from Last 1 Encounters:  12/22/17 5\' 2"  (1.575 m)    Weight:   Wt Readings from Last 1 Encounters:  12/22/17 81.5 kg    Ideal Body Weight:  50 kg  BMI:  Body mass index is 32.86 kg/m.  Estimated Nutritional Needs:   Kcal:  1550-1750  Protein:  75-90 grams  Fluid:  >1.5 L    [place]arre Diana A. Mayford KnifeWilliams, RD, LDN, CDE Pager: [phone] After hours Pager: [phone]

## 2017-12-23 NOTE — Consult Note (Signed)
WOC Nurse wound consult note Reason for Consult: Moisture associated skin damage perineal area. Present on admission.  Wound type:MASD Pressure Injury POA: NA Measurement: scattered nonintact lesions to groin and perineum Wound ZOX:WRUEbed:pink and moist Drainage (amount, consistency, odor) scant weeping Periwound:intact Dressing procedure/placement/frequency:Gerhardts barrier paste twice daily.  Will not follow at this time.  Please re-consult if needed.  Maple HudsonKaren Onyekachi Gathright MSN, RN, FNP-BC CWON Wound, Ostomy, Continence Nurse Pager [phone]

## 2017-12-24 DIAGNOSIS — L8990 Pressure ulcer of unspecified site, unspecified stage: Secondary | ICD-10-CM

## 2017-12-24 LAB — CBC
HEMATOCRIT: 41.9 % (ref 35.0–47.0)
Hemoglobin: 14.3 g/dL (ref 12.0–16.0)
MCH: 32.1 pg (ref 26.0–34.0)
MCHC: 34.1 g/dL (ref 32.0–36.0)
MCV: 94.2 fL (ref 80.0–100.0)
Platelets: 181 10*3/uL (ref 150–440)
RBC: 4.45 MIL/uL (ref 3.80–5.20)
RDW: 13.4 % (ref 11.5–14.5)
WBC: 10.9 10*3/uL (ref 3.6–11.0)

## 2017-12-24 LAB — BASIC METABOLIC PANEL
Anion gap: 5 (ref 5–15)
BUN: 17 mg/dL (ref 8–23)
CO2: 26 mmol/L (ref 22–32)
Calcium: 7.9 mg/dL — ABNORMAL LOW (ref 8.9–10.3)
Chloride: 109 mmol/L (ref 98–111)
Creatinine, Ser: 0.49 mg/dL (ref 0.44–1.00)
GFR calc Af Amer: >60 mL/min (ref >60)
GFR calc non Af Amer: >60 mL/min (ref >60)
GLUCOSE: 94 mg/dL (ref 70–99)
POTASSIUM: 3.7 mmol/L (ref 3.5–5.1)
Sodium: 140 mmol/L (ref 135–145)

## 2017-12-24 LAB — URINE CULTURE: Culture: >=100000 — AB

## 2017-12-24 MED ORDER — DIPHENHYDRAMINE HCL 25 MG PO CAPS
25.0000 mg | ORAL_CAPSULE | Freq: Four times a day (QID) | ORAL | Status: DC | PRN
  Administered 2017-12-24: 16:00:00 25 mg via ORAL
  Filled 2017-12-24: qty 1

## 2017-12-24 MED ORDER — LEVOFLOXACIN 500 MG PO TABS
500.0000 mg | ORAL_TABLET | Freq: Every day | ORAL | Status: DC
  Administered 2017-12-24 – 2017-12-26 (×3): 500 mg via ORAL
  Filled 2017-12-24 (×3): qty 1

## 2017-12-24 NOTE — Progress Notes (Signed)
[facility] [place] at [facility]   PATIENT NAME: Dominique Patel    MR#:  [id number]  DATE OF BIRTH:  1934/05/02  SUBJECTIVE:  CHIEF COMPLAINT:   Chief Complaint  Patient presents with  . Rash  . Weakness   - very confused, has a diffuse rash on her upper body - admitted for UTI  REVIEW OF SYSTEMS:  Review of Systems  Unable to perform ROS: Mental status change    DRUG ALLERGIES:  No Known Allergies  VITALS:  Blood pressure (!) 172/88, pulse 72, temperature 98.6 F (37 C), temperature source Oral, resp. rate 18, height 5\' 2"  (1.575 m), weight 81.5 kg, SpO2 98 %.  PHYSICAL EXAMINATION:  Physical Exam  GENERAL:  83 y.o.-year-old patient lying in the bed with no acute distress.  EYES: Pupils equal, round, reactive to light and accommodation. No scleral icterus. Extraocular muscles intact.  HEENT: Head atraumatic, normocephalic. Oropharynx and nasopharynx clear.  NECK:  Supple, no jugular venous distention. No thyroid enlargement, no tenderness.  LUNGS: Normal breath sounds bilaterally, no wheezing, rales,rhonchi or crepitation. No use of accessory muscles of respiration. Decreased bibasilar breath sounds CARDIOVASCULAR: S1, S2 normal. No  rubs, or gallops. 2/6 systolic murmur present. ABDOMEN: Soft, nontender, nondistended. Bowel sounds present. No organomegaly or mass.  EXTREMITIES: No pedal edema, cyanosis, or clubbing.  NEUROLOGIC: Cranial nerves II through XII are intact. Right sided hemiparesis from previous stroke.. Gait not checked.  PSYCHIATRIC: The patient is alert and oriented x 1-2.  SKIN: No obvious rash, lesion, or ulcer.    LABORATORY PANEL:   CBC Recent Labs  Lab 12/24/17 0314  WBC 10.9  HGB 14.3  HCT 41.9  PLT 181   ------------------------------------------------------------------------------------------------------------------  Chemistries  Recent Labs  Lab 12/22/17 1504  12/24/17 0314  NA 135   < > 140  K 3.9   < >  3.7  CL 100   < > 109  CO2 25   < > 26  GLUCOSE 115*   < > 94  BUN 27*   < > 17  CREATININE 0.56   < > 0.49  CALCIUM 8.2*   < > 7.9*  AST 30  --   --   ALT 32  --   --   ALKPHOS 68  --   --   BILITOT 1.1  --   --    < > = values in this interval not displayed.   ------------------------------------------------------------------------------------------------------------------  Cardiac Enzymes No results for input(s): TROPONINI in the last 168 hours. ------------------------------------------------------------------------------------------------------------------  RADIOLOGY:  Dg Chest Portable 1 View  Result Date: 12/22/2017 CLINICAL DATA:  Sepsis EXAM: PORTABLE CHEST 1 VIEW COMPARISON:  11/18/2017 FINDINGS: There is mild bilateral chronic interstitial thickening. There is no focal consolidation. There is no pleural effusion or pneumothorax. The heart and mediastinal contours are unremarkable. The osseous structures are unremarkable. IMPRESSION: No active disease. Electronically Signed   By: Elige Ko   On: 12/22/2017 15:55    EKG:   Orders placed or performed during the hospital encounter of 12/22/17  . EKG    ASSESSMENT AND PLAN:   83 year old female with past medical history significant for hypertension, history of stroke, chronic A. fib on Eliquis, dementia presents from short-term rehab for sepsis.  1. Sepsis- secondary to UTI- resolved now - urine cultures growing klebsiella- ABX changed to levaquin today due to rash - wbc improving, afebrile now - negative blood cultures  2.  C. difficile colitis-continue oral vancomycin.  Continue for 10 days after finishing Levaquin for UTI.  3.  Diffuse rash-appears like a drug rash.  Benadryl as needed.  Discontinue Rocephin.  Started on Levaquin now -No previous listed allergies  4.  Dementia-seems to be at baseline.  Continue Aricept  5.  History of chronic A. fib.  Rate controlled.  On Eliquis for  anticoagulation.  Patient is from [facility].  Family would like her to be placed into different rehab.  Social worker aware of this.   All the records are reviewed and case discussed with Care Management/Social Workerr. Management plans discussed with the patient, family and they are in agreement.  CODE STATUS: DNR  TOTAL TIME TAKING CARE OF THIS PATIENT: 37 minutes.   POSSIBLE D/C IN 2 DAYS, DEPENDING ON CLINICAL CONDITION.   Enid BaasKALISETTI,Hajime Asfaw M.D on 12/24/2017 at 1:01 PM  Between 7am to 6pm - Pager - [phone]  After 6pm go to [email or web address] - password [email or web address]  [facility] [place] [facility]  Office  [phone]  CC: Primary care physician; System, Pcp Not In

## 2017-12-25 NOTE — Plan of Care (Signed)
Pt denies pain. No c/o n/v.  Fair appetite.  Pt had 2xloose stools during the shift.  Rash improved since admission.

## 2017-12-25 NOTE — Progress Notes (Signed)
[facility] [place] at [facility]   PATIENT NAME: Dominique Patel    MR#:  [id number]  DATE OF BIRTH:  Aug 24, 1934  SUBJECTIVE:  CHIEF COMPLAINT:   Chief Complaint  Patient presents with  . Rash  . Weakness   - seems to be at baseline. apparently rash has been there prior to admission  REVIEW OF SYSTEMS:  Review of Systems  Unable to perform ROS: Mental status change    DRUG ALLERGIES:   Allergies  Allergen Reactions  . Fish Allergy   . Shrimp [Shellfish Allergy]     VITALS:  Blood pressure (!) 162/84, pulse 72, temperature 97.6 F (36.4 C), temperature source Oral, resp. rate 20, height 5\' 2"  (1.575 m), weight 81.5 kg, SpO2 99 %.  PHYSICAL EXAMINATION:  Physical Exam  GENERAL:  83 y.o.-year-old patient lying in the bed with no acute distress.  EYES: Pupils equal, round, reactive to light and accommodation. No scleral icterus. Extraocular muscles intact.  HEENT: Head atraumatic, normocephalic. Oropharynx and nasopharynx clear.  NECK:  Supple, no jugular venous distention. No thyroid enlargement, no tenderness.  LUNGS: Normal breath sounds bilaterally, no wheezing, rales,rhonchi or crepitation. No use of accessory muscles of respiration. Decreased bibasilar breath sounds CARDIOVASCULAR: S1, S2 normal. No  rubs, or gallops. 2/6 systolic murmur present. ABDOMEN: Soft, nontender, nondistended. Bowel sounds present. No organomegaly or mass.  EXTREMITIES: No pedal edema, cyanosis, or clubbing.  NEUROLOGIC: Cranial nerves II through XII are intact.  Motor and sensory strength intact, no focal deficits. Global weakness noted. Gait not checked.  PSYCHIATRIC: The patient is alert and oriented x 1-2.  SKIN: No obvious  lesion, or ulcer.  Diffuse rash on her trunk up to the thighs.  None on the face noted.  Has slightly raised margins with lighter all over.   LABORATORY PANEL:   CBC Recent Labs  Lab 12/24/17 0314  WBC 10.9  HGB 14.3  HCT 41.9  PLT 181     ------------------------------------------------------------------------------------------------------------------  Chemistries  Recent Labs  Lab 12/22/17 1504  12/24/17 0314  NA 135   < > 140  K 3.9   < > 3.7  CL 100   < > 109  CO2 25   < > 26  GLUCOSE 115*   < > 94  BUN 27*   < > 17  CREATININE 0.56   < > 0.49  CALCIUM 8.2*   < > 7.9*  AST 30  --   --   ALT 32  --   --   ALKPHOS 68  --   --   BILITOT 1.1  --   --    < > = values in this interval not displayed.   ------------------------------------------------------------------------------------------------------------------  Cardiac Enzymes No results for input(s): TROPONINI in the last 168 hours. ------------------------------------------------------------------------------------------------------------------  RADIOLOGY:  No results found.  EKG:   Orders placed or performed during the hospital encounter of 12/22/17  . EKG    ASSESSMENT AND PLAN:   83 year old female with past medical history significant for hypertension, history of stroke, chronic A. fib on Eliquis, dementia presents from short-term rehab for sepsis.  1. Sepsis- secondary to UTI- resolved now - urine cultures growing klebsiella- ABX changed to levaquin, treat for 5 days and stop - wbc improving, afebrile now - negative blood cultures  2.  C. difficile colitis-on oral vancomycin.  Continue for 10 days after finishing Levaquin for UTI.  3.  Diffuse rash- initially thought to be a drug rash.  Benadryl as needed.   - on Levaquin now -No previous listed allergies -However the RN taking care of the patient now, confirms that patient did have the rash and she initially presented to the hospital and it has been there for a while. -Will need outpatient dermatology follow-up for the same.  It is not pruritic or painful.  4.  Dementia-seems to be at baseline.  Continue Aricept  5.  History of chronic A. fib.  Rate controlled.  On Eliquis for  anticoagulation.  Patient is from [facility] assisted living facility.  PT recommended rehab..   All the records are reviewed and case discussed with Care Management/Social Workerr. Management plans discussed with the patient, family and they are in agreement.  CODE STATUS: DNR  TOTAL TIME TAKING CARE OF THIS PATIENT: 36 minutes.   POSSIBLE D/C IN 1-2 DAYS, DEPENDING ON CLINICAL CONDITION.   Enid Baas M.D on 12/25/2017 at 8:56 AM  Between 7am to 6pm - Pager - [phone]  After 6pm go to [email or web address] - password [email or web address]  [facility] [place] [facility]  Office  [phone]  CC: Primary care physician; System, Pcp Not In

## 2017-12-26 ENCOUNTER — Encounter
Admission: RE | Admit: 2017-12-26 | Discharge: 2017-12-26 | Disposition: A | Discharge status: Home / Self Care | Payer: Medicare Other | Source: Ambulatory Visit | Attending: Internal Medicine | Admitting: Internal Medicine

## 2017-12-26 MED ORDER — RISAQUAD PO CAPS
1.0000 | ORAL_CAPSULE | Freq: Two times a day (BID) | ORAL | Status: DC
  Administered 2017-12-26 – 2017-12-29 (×7): 1 via ORAL
  Filled 2017-12-26 (×7): qty 1

## 2017-12-26 NOTE — Progress Notes (Signed)
[facility] [place] at [facility]   PATIENT NAME: Dominique HeadsSally Patel    MR#:  [id number]  DATE OF BIRTH:  [birth year]  SUBJECTIVE:  CHIEF COMPLAINT:   Chief Complaint  Patient presents with  . Rash  . Weakness   - awaiting PASSR - no complaints, few loose stools. Overall better  REVIEW OF SYSTEMS:  Review of Systems  Unable to perform ROS: Dementia    DRUG ALLERGIES:   Allergies  Allergen Reactions  . Fish Allergy   . Shrimp [Shellfish Allergy]     VITALS:  Blood pressure 128/65, pulse 61, temperature 97.6 F (36.4 C), temperature source Oral, resp. rate 18, height 5\' 2"  (1.575 m), weight 81.5 kg, SpO2 96 %.  PHYSICAL EXAMINATION:  Physical Exam  GENERAL:  83 y.o.-year-old patient lying in the bed with no acute distress.  EYES: Pupils equal, round, reactive to light and accommodation. No scleral icterus. Extraocular muscles intact.  HEENT: Head atraumatic, normocephalic. Oropharynx and nasopharynx clear.  NECK:  Supple, no jugular venous distention. No thyroid enlargement, no tenderness.  LUNGS: Normal breath sounds bilaterally, no wheezing, rales,rhonchi or crepitation. No use of accessory muscles of respiration. Decreased bibasilar breath sounds CARDIOVASCULAR: S1, S2 normal. No  rubs, or gallops. 2/6 systolic murmur present. ABDOMEN: Soft, nontender, nondistended. Bowel sounds present. No organomegaly or mass.  EXTREMITIES: No pedal edema, cyanosis, or clubbing.  NEUROLOGIC: Cranial nerves II through XII are intact.  Motor and sensory strength intact, no focal deficits. Global weakness noted. Gait not checked.  PSYCHIATRIC: The patient is alert and oriented x 1-2.  SKIN: No obvious  lesion, or ulcer.  Diffuse rash on her trunk up to the thighs.  None on the face noted.  Has slightly raised margins with lighter all over.   LABORATORY PANEL:   CBC Recent Labs  Lab 12/24/17 0314  WBC 10.9  HGB 14.3  HCT 41.9  PLT 181    ------------------------------------------------------------------------------------------------------------------  Chemistries  Recent Labs  Lab 12/22/17 1504  12/24/17 0314  NA 135   < > 140  K 3.9   < > 3.7  CL 100   < > 109  CO2 25   < > 26  GLUCOSE 115*   < > 94  BUN 27*   < > 17  CREATININE 0.56   < > 0.49  CALCIUM 8.2*   < > 7.9*  AST 30  --   --   ALT 32  --   --   ALKPHOS 68  --   --   BILITOT 1.1  --   --    < > = values in this interval not displayed.   ------------------------------------------------------------------------------------------------------------------  Cardiac Enzymes No results for input(s): TROPONINI in the last 168 hours. ------------------------------------------------------------------------------------------------------------------  RADIOLOGY:  No results found.  EKG:   Orders placed or performed during the hospital encounter of 12/22/17  . EKG    ASSESSMENT AND PLAN:   83 year old female with past medical history significant for hypertension, history of stroke, chronic A. fib on Eliquis, dementia presents from short-term rehab for sepsis.  1. Sepsis- secondary to UTI- resolved now - urine cultures growing klebsiella- ABX changed to levaquin, treat for 5 days and stop - wbc improving, afebrile now - negative blood cultures  2.  C. difficile colitis-on oral vancomycin.  Continue for 10 days after finishing Levaquin for UTI.  3.  Diffuse rash- initially thought to be a drug rash.  Benadryl as needed.   -No previous  listed allergies -However the RN taking care of the patient now, confirms that patient did have the rash and she initially presented to the hospital and it has been there for a while. -Will need outpatient dermatology follow-up for the same.  It is not pruritic or painful.  4.  Dementia-seems to be at baseline.  Continue Aricept  5.  History of chronic A. fib.  Rate controlled.  On Eliquis for  anticoagulation.  Patient is from [facility] assisted living facility.  PT recommended rehab.. Awaiting PASSR, likely discharge tomorrow to [facility]   All the records are reviewed and case discussed with Care Management/Social Workerr. Management plans discussed with the patient, family and they are in agreement.  CODE STATUS: DNR  TOTAL TIME TAKING CARE OF THIS PATIENT: 33 minutes.   POSSIBLE D/C IN 1-2 DAYS, DEPENDING ON CLINICAL CONDITION.   Dominique Patel M.D on 12/26/2017 at 2:35 PM  Between 7am to 6pm - Pager - [phone]  After 6pm go to [email or web address] - password [email or web address]  [facility] [place] [facility]  Office  [phone]  CC: Primary care physician; System, Pcp Not In

## 2017-12-26 NOTE — Progress Notes (Signed)
Clinical Child psychotherapistocial Worker (CSW) contacted patient's nephew [name] and presented bed offers. [facility] and has a private room for patient because she has c-diff. PASARR is pending.   Baker Hughes IncorporatedBailey Laysha Childers, LCSW [phone]

## 2017-12-26 NOTE — Care Management Important Message (Signed)
Important Message  Patient Details  Name: Dominique Patel MRN: [id number] Date of Birth: [birth year]   Medicare Important Message Given:  Yes    Eber HongGreene, [place] R, RN 12/26/2017, 6:18 PM

## 2017-12-26 NOTE — Plan of Care (Signed)

## 2017-12-27 LAB — CBC
HCT: 39.7 % (ref 35.0–47.0)
Hemoglobin: 13.7 g/dL (ref 12.0–16.0)
MCH: 32 pg (ref 26.0–34.0)
MCHC: 34.6 g/dL (ref 32.0–36.0)
MCV: 92.6 fL (ref 80.0–100.0)
PLATELETS: 170 10*3/uL (ref 150–440)
RBC: 4.28 MIL/uL (ref 3.80–5.20)
RDW: 13.4 % (ref 11.5–14.5)
WBC: 11.8 10*3/uL — ABNORMAL HIGH (ref 3.6–11.0)

## 2017-12-27 LAB — CULTURE, BLOOD (ROUTINE X 2)
CULTURE: NO GROWTH
CULTURE: NO GROWTH
Special Requests: ADEQUATE

## 2017-12-27 LAB — BASIC METABOLIC PANEL
Anion gap: 4 — ABNORMAL LOW (ref 5–15)
BUN: 19 mg/dL (ref 8–23)
CHLORIDE: 103 mmol/L (ref 98–111)
CO2: 31 mmol/L (ref 22–32)
CREATININE: 0.56 mg/dL (ref 0.44–1.00)
Calcium: 8 mg/dL — ABNORMAL LOW (ref 8.9–10.3)
GFR calc Af Amer: >60 mL/min (ref >60)
GFR calc non Af Amer: >60 mL/min (ref >60)
GLUCOSE: 88 mg/dL (ref 70–99)
Potassium: 3.7 mmol/L (ref 3.5–5.1)
SODIUM: 138 mmol/L (ref 135–145)

## 2017-12-27 NOTE — Progress Notes (Signed)
Physical Therapy Treatment Patient Details Name: Dominique Patel MRN: [id number] DOB: 11/29/1934 Today's Date: 12/27/2017    History of Present Illness presented to ER secondary to diarrhea, rash and generalized weakness; admitted with sepsis due to UTI.    PT Comments    Pt asleep but awoke with encouragement.  To edge of bed with min a x 1.  Once sitting, she is able to maintain sitting without assist.  Attempted standing with walker on 2 attempts but she was unable to stand fully and quickly sat down.  Attempted stand pivot to chair but again, she was unable to stand fully to allow for safe +1 assist to chair.  Nurse tech came in and assisted with transfer.  Squat pivot with mod/max a x 1.  During transfer she was inc of loose BM and care was given.  Pt very red with some open ares.  Cream applied by nurse tech after bathing.     Follow Up Recommendations  SNF     Equipment Recommendations       Recommendations for Other Services       Precautions / Restrictions Precautions Precautions: Fall;Other (comment) Precaution Comments: enteric precautions and has rash all over body Restrictions Weight Bearing Restrictions: No    Mobility  Bed Mobility Overal bed mobility: Needs Assistance Bed Mobility: Supine to Sit     Supine to sit: Min assist;Mod assist     General bed mobility comments: good effort but needed asssit to come fully upright.  Transfers Overall transfer level: Needs assistance Equipment used: Rolling walker (2 wheeled);None Transfers: Sit to/from [employer] Sit to Stand: Mod assist;Max assist   Squat pivot transfers: Mod assist;+2 physical assistance     General transfer comment: Pt unable to stand fully with walker, stand pivot or +2 assist trasnfer technique.  She was able to get to the chair with +2 assist and squat pivot but never stood fully.  Ambulation/Gait             General Gait Details: unsafe/unable   Stairs             Wheelchair Mobility    Modified Rankin (Stroke Patients Only)       Balance Overall balance assessment: Needs assistance Sitting-balance support: No upper extremity supported;Feet supported Sitting balance-Leahy Scale: Good     Standing balance support: Bilateral upper extremity supported Standing balance-Leahy Scale: Zero                              Cognition Arousal/Alertness: Awake/alert Behavior During Therapy: WFL for tasks assessed/performed Overall Cognitive Status: History of cognitive impairments - at baseline                                        Exercises Other Exercises Other Exercises: Pt inc of loose BM during transfer.  Assisted nurse tech with care.    General Comments        Pertinent Vitals/Pain Pain Assessment: Faces Faces Pain Scale: Hurts whole lot Pain Location: groin and periarea with care - pt very red with open areas.  Nurse tech applied cream with care. Pain Descriptors / Indicators: Sore Pain Intervention(s): Limited activity within patient's tolerance;Monitored during session    Home Living  Prior Function            PT Goals (current goals can now be found in the care plan section) Progress towards PT goals: Progressing toward goals    Frequency    Min 2X/week      PT Plan Current plan remains appropriate    Co-evaluation              AM-PAC PT "6 Clicks" Daily Activity  Outcome Measure  Difficulty turning over in bed (including adjusting bedclothes, sheets and blankets)?: Unable Difficulty moving from lying on back to sitting on the side of the bed? : Unable Difficulty sitting down on and standing up from a chair with arms (e.g., wheelchair, bedside commode, etc,.)?: Unable Help needed moving to and from a bed to chair (including a wheelchair)?: Total Help needed walking in hospital room?: Total Help needed climbing 3-5 steps with a railing? :  Total 6 Click Score: 6    End of Session Equipment Utilized During Treatment: Gait belt Activity Tolerance: Patient limited by fatigue Patient left: in chair;with chair alarm set;with call bell/phone within reach;with nursing/sitter in room Nurse Communication: Mobility status       Time: 0922-0949 PT Time Calculation (min) (ACUTE ONLY): 27 min  Charges:  $Therapeutic Exercise: 8-22 mins $Therapeutic Activity: 8-22 mins                     Danielle DessSarah Orissa Patel, PTA 12/27/17, 10:34 AM

## 2017-12-27 NOTE — Progress Notes (Signed)
[facility] [place] [facility]   PATIENT NAME: Dominique HeadsSally Patel    MR#:  [id number]  DATE OF BIRTH:  02-04-1935  SUBJECTIVE:  CHIEF COMPLAINT:   Chief Complaint  Patient presents with  . Rash  . Weakness   - awaiting PASSR - diarrhea still persistent today  REVIEW OF SYSTEMS:  Review of Systems  Unable to perform ROS: Dementia    DRUG ALLERGIES:   Allergies  Allergen Reactions  . Fish Allergy   . Shrimp [Shellfish Allergy]     VITALS:  Blood pressure 117/61, pulse 60, temperature 97.8 F (36.6 C), temperature source Oral, resp. rate 20, height 5\' 2"  (1.575 m), weight 81.5 kg, SpO2 95 %.  PHYSICAL EXAMINATION:  Physical Exam  GENERAL:  83 y.o.-year-old patient lying in the bed with no acute distress.  EYES: Pupils equal, round, reactive to light and accommodation. No scleral icterus. Extraocular muscles intact.  HEENT: Head atraumatic, normocephalic. Oropharynx and nasopharynx clear.  NECK:  Supple, no jugular venous distention. No thyroid enlargement, no tenderness.  LUNGS: Normal breath sounds bilaterally, no wheezing, rales,rhonchi or crepitation. No use of accessory muscles of respiration. Decreased bibasilar breath sounds CARDIOVASCULAR: S1, S2 normal. No  rubs, or gallops. 2/6 systolic murmur present. ABDOMEN: Soft, nontender, nondistended. Bowel sounds present. No organomegaly or mass.  EXTREMITIES: No pedal edema, cyanosis, or clubbing.  NEUROLOGIC: Cranial nerves II through XII are intact.  Motor and sensory strength intact, no focal deficits. Global weakness noted. Gait not checked.  PSYCHIATRIC: The patient is alert and oriented x 1-2.  SKIN: No obvious  lesion, or ulcer.  Diffuse rash on her trunk up to the thighs.  None on the face noted.  Has slightly raised margins with lighter all over.   LABORATORY PANEL:   CBC Recent Labs  Lab 12/27/17 0330  WBC 11.8*  HGB 13.7  HCT 39.7  PLT 170    ------------------------------------------------------------------------------------------------------------------  Chemistries  Recent Labs  Lab 12/22/17 1504  12/27/17 0330  NA 135   < > 138  K 3.9   < > 3.7  CL 100   < > 103  CO2 25   < > 31  GLUCOSE 115*   < > 88  BUN 27*   < > 19  CREATININE 0.56   < > 0.56  CALCIUM 8.2*   < > 8.0*  AST 30  --   --   ALT 32  --   --   ALKPHOS 68  --   --   BILITOT 1.1  --   --    < > = values in this interval not displayed.   ------------------------------------------------------------------------------------------------------------------  Cardiac Enzymes No results for input(s): TROPONINI in the last 168 hours. ------------------------------------------------------------------------------------------------------------------  RADIOLOGY:  No results found.  EKG:   Orders placed or performed during the hospital encounter of 12/22/17  . EKG    ASSESSMENT AND PLAN:   83 year old female with past medical history significant for hypertension, history of stroke, chronic A. fib on Eliquis, dementia presents from short-term rehab for sepsis.  1. Sepsis- secondary to UTI- resolved now - urine cultures growing klebsiella- ABX changed to levaquin, treat for 5 days and stop - wbc improving, afebrile now - negative blood cultures  2.  C. difficile colitis-on oral vancomycin.  Continue for 10 days after finishing Levaquin for UTI. -Check with pharmacy to increase the dose of vancomycin to see if that would improve her diarrhea.  Probiotics recommended  3.  Diffuse rash- initially thought to be a drug rash.  Benadryl as needed.   -No previous listed allergies -However the RN taking care of the patient now, confirms that patient did have the rash and she initially presented to the hospital and it has been there for a while. -Will need outpatient dermatology follow-up for the same.  It is not pruritic or painful.  4.  Dementia-seems to be  at baseline.  Continue Aricept  5.  History of chronic A. fib.  Rate controlled.  On Eliquis for anticoagulation.  Patient is from [facility] assisted living facility.  PT recommended rehab.. Awaiting PASSR, likely discharge tomorrow to [facility]   All the records are reviewed and case discussed with Care Management/Social Workerr. Management plans discussed with the patient, family and they are in agreement.  CODE STATUS: DNR  TOTAL TIME TAKING CARE OF THIS PATIENT: 35 minutes.   POSSIBLE D/C IN 1-2 DAYS, DEPENDING ON CLINICAL CONDITION.   Enid Baas M.D on 12/27/2017 at 2:07 PM  Between 7am to 6pm - Pager - [phone]  After 6pm go to [email or web address] - password [employer]  [facility]  Office  [phone]  CC: Primary care physician; System, Pcp Not In

## 2017-12-28 MED ORDER — RISAQUAD PO CAPS: 1.0000 | ORAL_CAPSULE | Freq: Two times a day (BID) | ORAL | 0 refills | Status: DC

## 2017-12-28 MED ORDER — ACETAMINOPHEN 325 MG PO TABS: 650.0000 mg | ORAL_TABLET | Freq: Four times a day (QID) | ORAL | Status: AC | PRN

## 2017-12-28 MED ORDER — TRAMADOL HCL 50 MG PO TABS: 25.0000 mg | ORAL_TABLET | Freq: Three times a day (TID) | ORAL | 0 refills | Status: DC | PRN

## 2017-12-28 MED ORDER — VANCOMYCIN 50 MG/ML ORAL SOLUTION: 125.0000 mg | Freq: Four times a day (QID) | ORAL | 0 refills | Status: AC

## 2017-12-28 NOTE — Discharge Summary (Signed)
[facility] [place] at [facility]   PATIENT NAME: Dominique Patel    MR#:  [id number]  DATE OF BIRTH:  07/21/1934  DATE OF ADMISSION:  12/22/2017   ADMITTING PHYSICIAN: Campbell Stall, MD  DATE OF DISCHARGE: 12/28/17  PRIMARY CARE PHYSICIAN: System, Pcp Not In   ADMISSION DIAGNOSIS:   Rash [R21] C. difficile diarrhea [A04.72] Sepsis due to urinary tract infection (HCC) [A41.9, N39.0] Pressure injury of sacral region, stage 1 [L89.151]  DISCHARGE DIAGNOSIS:   Active Problems:   Sepsis due to gram-negative UTI (HCC)   Pressure injury of skin   SECONDARY DIAGNOSIS:   Past Medical History:  Diagnosis Date  . Anxiety disorder   . Cataracts, bilateral   . Dementia   . Hypertension   . Major depressive disorder     HOSPITAL COURSE:   83 year old female with past medical history significant for hypertension, history of stroke, chronic A. fib on Eliquis, dementia presents from short-term rehab for sepsis.  1. Sepsis- secondary to UTI- resolved now - urine cultures growing klebsiella- ABX changed to levaquin- finsihed the trt - wbc improving, afebrile now - negative blood cultures  2.  C. difficile colitis-on oral vancomycin.  Continue for 10 days after finishing Levaquin for UTI. -Check with pharmacy to increase the dose of vancomycin to see if that would improve her diarrhea.  Probiotics recommended  3.  Diffuse rash- initially thought to be a drug rash.  Benadryl as needed.   -No previous listed allergies -However the RN taking care of the patient now, confirms that patient did have the rash and she initially presented to the hospital and it has been there for a while. -Will need outpatient dermatology follow-up for the same.  It is not pruritic or painful.  4.  Dementia-seems to be at baseline.  Continue Aricept  5.  History of chronic A. fib.  Rate controlled.  On Eliquis for anticoagulation.  Patient is from [facility] assisted living  facility.  PT recommended rehab.. Awaiting PASSR, likely discharge today to [facility]  DISCHARGE CONDITIONS:   Guarded  CONSULTS OBTAINED:   None  DRUG ALLERGIES:   Allergies  Allergen Reactions  . Fish Allergy   . Shrimp [Shellfish Allergy]    DISCHARGE MEDICATIONS:   Allergies as of 12/28/2017      Reactions   Fish Allergy    Shrimp [shellfish Allergy]       Medication List    STOP taking these medications   acetaminophen 650 MG CR tablet Commonly known as:  TYLENOL Replaced by:  acetaminophen 325 MG tablet   loperamide 2 MG capsule Commonly known as:  IMODIUM   predniSONE 10 MG (21) Tbpk tablet Commonly known as:  STERAPRED UNI-PAK 21 TAB   predniSONE 10 MG tablet Commonly known as:  DELTASONE     TAKE these medications   acetaminophen 325 MG tablet Commonly known as:  TYLENOL Take 2 tablets (650 mg total) by mouth every 6 (six) hours as needed for mild pain (or Fever >/= 101). Replaces:  acetaminophen 650 MG CR tablet   acidophilus Caps capsule Take 1 capsule by mouth 2 (two) times daily.   albuterol 108 (90 Base) MCG/ACT inhaler Commonly known as:  PROVENTIL HFA;VENTOLIN HFA Inhale 2 puffs into the lungs every 4 (four) hours as needed for wheezing or shortness of breath (or cough).   apixaban 5 MG Tabs tablet Commonly known as:  ELIQUIS Take 1 tablet (5 mg total) by mouth  2 (two) times daily.   atenolol 50 MG tablet Commonly known as:  TENORMIN Take 1 tablet (50 mg total) by mouth daily.   benzonatate 100 MG capsule Commonly known as:  TESSALON Take 1 capsule (100 mg total) by mouth every 6 (six) hours as needed for cough.   cholecalciferol 1000 units tablet Commonly known as:  VITAMIN D Take 2,000 Units by mouth daily.   diltiazem 120 MG 24 hr capsule Commonly known as:  CARDIZEM CD Take 1 capsule (120 mg total) by mouth daily.   donepezil 10 MG tablet Commonly known as:  ARICEPT Take 1 tablet by mouth at bedtime.   lisinopril  10 MG tablet Commonly known as:  PRINIVIL,ZESTRIL Take 1 tablet (10 mg total) by mouth daily.   QUEtiapine 25 MG tablet Commonly known as:  SEROQUEL Take 12.5 mg by mouth 2 (two) times daily.   sertraline 25 MG tablet Commonly known as:  ZOLOFT Take 1 tablet by mouth every other day.   Spacer/Aero-Holding Rudean Curt 1 puff by Does not apply route every 4 (four) hours as needed.   traMADol 50 MG tablet Commonly known as:  ULTRAM Take 0.5 tablets (25 mg total) by mouth every 8 (eight) hours as needed for moderate pain or severe pain. What changed:    when to take this  reasons to take this  additional instructions   vancomycin 50 mg/mL  oral solution Commonly known as:  VANCOCIN Take 2.5 mLs (125 mg total) by mouth 4 (four) times daily for 10 days.   vitamin B-12 250 MCG tablet Commonly known as:  CYANOCOBALAMIN Take 250 mcg by mouth daily.        DISCHARGE INSTRUCTIONS:   1. PCP f/u in 1 week 2. Dermatology f/u in 1 week for rash  DIET:   Cardiac diet  ACTIVITY:   Activity as tolerated  OXYGEN:   Home Oxygen: No.  Oxygen Delivery: room air  DISCHARGE LOCATION:   nursing home   If you experience worsening of your admission symptoms, develop shortness of breath, life threatening emergency, suicidal or homicidal thoughts you must seek medical attention immediately by calling 911 or calling your MD immediately  if symptoms less severe.  You Must read complete instructions/literature along with all the possible adverse reactions/side effects for all the Medicines you take and that have been prescribed to you. Take any new Medicines after you have completely understood and accpet all the possible adverse reactions/side effects.   Please note  You were cared for by a hospitalist during your hospital stay. If you have any questions about your discharge medications or the care you received while you were in the hospital after you are discharged, you can call  the unit and asked to speak with the hospitalist on call if the hospitalist that took care of you is not available. Once you are discharged, your primary care physician will handle any further medical issues. Please note that NO REFILLS for any discharge medications will be authorized once you are discharged, as it is imperative that you return to your primary care physician (or establish a relationship with a primary care physician if you do not have one) for your aftercare needs so that they can reassess your need for medications and monitor your lab values.    On the day of Discharge:  VITAL SIGNS:   Blood pressure (!) 105/48, pulse (!) 57, temperature 97.7 F (36.5 C), temperature source Oral, resp. rate 18, height 5\' 2"  (1.575 m),  weight 81.5 kg, SpO2 95 %.  PHYSICAL EXAMINATION:   GENERAL:  83 y.o.-year-old patient lying in the bed with no acute distress.  EYES: Pupils equal, round, reactive to light and accommodation. No scleral icterus. Extraocular muscles intact.  HEENT: Head atraumatic, normocephalic. Oropharynx and nasopharynx clear.  NECK:  Supple, no jugular venous distention. No thyroid enlargement, no tenderness.  LUNGS: Normal breath sounds bilaterally, no wheezing, rales,rhonchi or crepitation. No use of accessory muscles of respiration. Decreased bibasilar breath sounds CARDIOVASCULAR: S1, S2 normal. No  rubs, or gallops. 2/6 systolic murmur present. ABDOMEN: Soft, nontender, nondistended. Bowel sounds present. No organomegaly or mass.  EXTREMITIES: No pedal edema, cyanosis, or clubbing.  NEUROLOGIC: Cranial nerves II through XII are intact.  Motor and sensory strength intact, no focal deficits. Global weakness noted. Gait not checked.  PSYCHIATRIC: The patient is alert and oriented x 1-2.  SKIN: No obvious  lesion, or ulcer.  Diffuse rash on her trunk up to the thighs.  None on the face noted.  Has slightly raised margins with lighter all over.  DATA REVIEW:    CBC Recent Labs  Lab 12/27/17 0330  WBC 11.8*  HGB 13.7  HCT 39.7  PLT 170    Chemistries  Recent Labs  Lab 12/22/17 1504  12/27/17 0330  NA 135   < > 138  K 3.9   < > 3.7  CL 100   < > 103  CO2 25   < > 31  GLUCOSE 115*   < > 88  BUN 27*   < > 19  CREATININE 0.56   < > 0.56  CALCIUM 8.2*   < > 8.0*  AST 30  --   --   ALT 32  --   --   ALKPHOS 68  --   --   BILITOT 1.1  --   --    < > = values in this interval not displayed.     Microbiology Results  Results for orders placed or performed during the hospital encounter of 12/22/17  C difficile quick scan w PCR reflex     Status: Abnormal   Collection Time: 12/22/17  1:13 PM  Result Value Ref Range Status   C Diff antigen POSITIVE (A) NEGATIVE Final   C Diff toxin POSITIVE (A) NEGATIVE Final    Comment: RESULT CALLED TO, READ BACK BY AND VERIFIED WITH: TONY BAWLES @1628  12/22/17 AKT    C Diff interpretation Toxin producing C. difficile detected.  Final    Comment: Performed at [facility], [address] [place], [place] [address]  Urine Culture     Status: Abnormal   Collection Time: 12/22/17  3:39 PM  Result Value Ref Range Status   Specimen Description   Final    URINE, RANDOM Performed at [facility], [address], [place], [place] [address]    Special Requests   Final    NONE Performed at [facility], [address] [place], [place] [address]    Culture >=100,000 COLONIES/mL KLEBSIELLA PNEUMONIAE (A)  Final   Report Status 12/24/2017 FINAL  Final   Organism ID, Bacteria KLEBSIELLA PNEUMONIAE (A)  Final      Susceptibility   Klebsiella pneumoniae - MIC*    AMPICILLIN >=32 RESISTANT Resistant     CEFAZOLIN <=4 SENSITIVE Sensitive     CEFTRIAXONE <=1 SENSITIVE Sensitive     CIPROFLOXACIN <=0.25 SENSITIVE Sensitive     GENTAMICIN <=1 SENSITIVE Sensitive  IMIPENEM <=0.25 SENSITIVE Sensitive     NITROFURANTOIN 64 INTERMEDIATE Intermediate     TRIMETH/SULFA <=20  SENSITIVE Sensitive     AMPICILLIN/SULBACTAM 4 SENSITIVE Sensitive     PIP/TAZO 8 SENSITIVE Sensitive     Extended ESBL NEGATIVE Sensitive     * >=100,000 COLONIES/mL KLEBSIELLA PNEUMONIAE  Blood culture (routine x 2)     Status: None   Collection Time: 12/22/17  4:46 PM  Result Value Ref Range Status   Specimen Description BLOOD LEFT HAND  Final   Special Requests   Final    BOTTLES DRAWN AEROBIC AND ANAEROBIC Blood Culture adequate volume   Culture   Final    NO GROWTH 5 DAYS Performed at [facility], [address] [place], [place] [address]    Report Status 12/27/2017 FINAL  Final  Blood culture (routine x 2)     Status: None   Collection Time: 12/22/17  5:54 PM  Result Value Ref Range Status   Specimen Description BLOOD BLOOD LEFT HAND  Final   Special Requests   Final    BOTTLES DRAWN AEROBIC AND ANAEROBIC Blood Culture results may not be optimal due to an inadequate volume of blood received in culture bottles   Culture   Final    NO GROWTH 5 DAYS Performed at [facility], [address] [place], [place] [address]    Report Status 12/27/2017 FINAL  Final  MRSA PCR Screening     Status: None   Collection Time: 12/22/17 10:56 PM  Result Value Ref Range Status   MRSA by PCR NEGATIVE NEGATIVE Final    Comment:        The GeneXpert MRSA Assay (FDA approved for NASAL specimens only), is one component of a comprehensive MRSA colonization surveillance program. It is not intended to diagnose MRSA infection nor to guide or monitor treatment for MRSA infections. Performed at [facility], [address], [place], [place] [address]     RADIOLOGY:  No results found.   Management plans discussed with the patient, family and they are in agreement.  CODE STATUS:     Code Status Orders  (From admission, onward)         Start     Ordered   12/22/17 1901  Do not attempt resuscitation (DNR)  Continuous    Question Answer Comment  In  the event of cardiac or respiratory ARREST Do not call a "code blue"   In the event of cardiac or respiratory ARREST Do not perform Intubation, CPR, defibrillation or ACLS   In the event of cardiac or respiratory ARREST Use medication by any route, position, wound care, and other measures to relive pain and suffering. May use oxygen, suction and manual treatment of airway obstruction as needed for comfort.      12/22/17 1900        Code Status History    Date Active Date Inactive Code Status Order ID Comments User Context   11/19/2017 0239 11/22/2017 2136 Full Code [id number]  Cammy Copa, MD Inpatient   11/27/2015 2202 11/29/2015 1557 Full Code [id number]  Oralia Manis, MD Inpatient    Advance Directive Documentation     Most Recent Value  Type of Advance Directive  Out of facility DNR (pink MOST or yellow form)  Pre-existing out of facility DNR order (yellow form or pink MOST form)  Physician notified to receive inpatient order  "MOST" Form in Place?  -      TOTAL TIME  TAKING CARE OF THIS PATIENT: 38 minutes.    Enid Baas M.D on 12/28/2017 at 3:16 PM  Between 7am to 6pm - Pager - [phone]  After 6pm go to [email or web address] - Social research officer, government  [facility]  Office  [phone]  CC: Primary care physician; System, Pcp Not In   Note: This dictation was prepared with Dragon dictation along with smaller phrase technology. Any transcriptional errors that result from this process are unintentional.

## 2017-12-28 NOTE — Care Management Important Message (Signed)
Important Message  Patient Details  Name: Dominique Patel MRN: [id number] Date of Birth: [birth year]   Medicare Important Message Given:  Yes    Gwenette Greet, RN 12/28/2017, 10:51 AM

## 2017-12-29 MED ORDER — DILTIAZEM HCL ER COATED BEADS 120 MG PO CP24: 120.0000 mg | ORAL_CAPSULE | Freq: Every day | ORAL | 0 refills | Status: AC

## 2017-12-29 MED ORDER — SODIUM CHLORIDE 0.9 % IV BOLUS
250.0000 mL | Freq: Once | INTRAVENOUS | Status: AC
  Administered 2017-12-29: 250 mL via INTRAVENOUS

## 2017-12-29 NOTE — Discharge Summary (Signed)
[employer] [facility] [place] at [facility]   PATIENT NAME: Dominique Patel    MR#:  [id number]  DATE OF BIRTH:  [birth year]  DATE OF ADMISSION:  12/22/2017   ADMITTING PHYSICIAN: Campbell Stall, MD  DATE OF DISCHARGE: 12/29/17  PRIMARY CARE PHYSICIAN: System, Pcp Not In   ADMISSION DIAGNOSIS:   Rash [R21] C. difficile diarrhea [A04.72] Sepsis due to urinary tract infection (HCC) [A41.9, N39.0] Pressure injury of sacral region, stage 1 [L89.151]  DISCHARGE DIAGNOSIS:   Active Problems:   Sepsis due to gram-negative UTI (HCC)   Pressure injury of skin   SECONDARY DIAGNOSIS:   Past Medical History:  Diagnosis Date  . Anxiety disorder   . Cataracts, bilateral   . Dementia   . Hypertension   . Major depressive disorder     HOSPITAL COURSE:   83 year old female with past medical history significant for hypertension, history of stroke, chronic A. fib on Eliquis, dementia presents from short-term rehab for sepsis.  1. Sepsis- secondary to UTI- resolved now - urine cultures growing klebsiella- ABX changed to levaquin- finsihed the trt - wbc improving, afebrile now - negative blood cultures  2.  C. difficile colitis-on oral vancomycin.  Continue for 10 days after finishing Levaquin for UTI. -Check with pharmacy to increase the dose of vancomycin to see if that would improve her diarrhea.  Probiotics recommended  3.  Diffuse rash- initially thought to be a drug rash.  Benadryl as needed.   -No previous listed allergies -However the RN taking care of the patient now, confirms that patient did have the rash and she initially presented to the hospital and it has been there for a while. -Will need outpatient dermatology follow-up for the same.  It is not pruritic or painful.  4.  Dementia-seems to be at baseline.  Continue Aricept  5.  History of chronic A. fib.  Rate controlled.  Discontinued atenolol due to low blood pressure.  Continue Cardizem for now.  On  Eliquis for anticoagulation.  6.  Hypertension-low normal blood pressure here.  Discontinued atenolol.  Continue low-dose lisinopril and also on Cardizem.  Patient is from [facility] assisted living facility.  PT recommended rehab.. Awaiting PASSR, likely discharge today to [facility]  DISCHARGE CONDITIONS:   Guarded  CONSULTS OBTAINED:   None  DRUG ALLERGIES:   Allergies  Allergen Reactions  . Fish Allergy   . Shrimp [Shellfish Allergy]    DISCHARGE MEDICATIONS:   Allergies as of 12/29/2017      Reactions   Fish Allergy    Shrimp [shellfish Allergy]       Medication List    STOP taking these medications   acetaminophen 650 MG CR tablet Commonly known as:  TYLENOL Replaced by:  acetaminophen 325 MG tablet   atenolol 50 MG tablet Commonly known as:  TENORMIN   loperamide 2 MG capsule Commonly known as:  IMODIUM   predniSONE 10 MG (21) Tbpk tablet Commonly known as:  STERAPRED UNI-PAK 21 TAB   predniSONE 10 MG tablet Commonly known as:  DELTASONE     TAKE these medications   acetaminophen 325 MG tablet Commonly known as:  TYLENOL Take 2 tablets (650 mg total) by mouth every 6 (six) hours as needed for mild pain (or Fever >/= 101). Replaces:  acetaminophen 650 MG CR tablet   acidophilus Caps capsule Take 1 capsule by mouth 2 (two) times daily.   albuterol 108 (90 Base) MCG/ACT inhaler Commonly known as:  PROVENTIL HFA;VENTOLIN HFA Inhale 2 puffs into the lungs every 4 (four) hours as needed for wheezing or shortness of breath (or cough).   apixaban 5 MG Tabs tablet Commonly known as:  ELIQUIS Take 1 tablet (5 mg total) by mouth 2 (two) times daily.   benzonatate 100 MG capsule Commonly known as:  TESSALON Take 1 capsule (100 mg total) by mouth every 6 (six) hours as needed for cough.   cholecalciferol 1000 units tablet Commonly known as:  VITAMIN D Take 2,000 Units by mouth daily.   diltiazem 120 MG 24 hr capsule Commonly known as:   CARDIZEM CD Take 1 capsule (120 mg total) by mouth daily. Hold the medicine if heart rate is less than 60 or systolic blood pressure less than 100 What changed:  additional instructions   donepezil 10 MG tablet Commonly known as:  ARICEPT Take 1 tablet by mouth at bedtime.   lisinopril 10 MG tablet Commonly known as:  PRINIVIL,ZESTRIL Take 1 tablet (10 mg total) by mouth daily.   QUEtiapine 25 MG tablet Commonly known as:  SEROQUEL Take 12.5 mg by mouth 2 (two) times daily.   sertraline 25 MG tablet Commonly known as:  ZOLOFT Take 1 tablet by mouth every other day.   Spacer/Aero-Holding Rudean Curt 1 puff by Does not apply route every 4 (four) hours as needed.   traMADol 50 MG tablet Commonly known as:  ULTRAM Take 0.5 tablets (25 mg total) by mouth every 8 (eight) hours as needed for moderate pain or severe pain. What changed:    when to take this  reasons to take this  additional instructions   vancomycin 50 mg/mL  oral solution Commonly known as:  VANCOCIN Take 2.5 mLs (125 mg total) by mouth 4 (four) times daily for 10 days.   vitamin B-12 250 MCG tablet Commonly known as:  CYANOCOBALAMIN Take 250 mcg by mouth daily.        DISCHARGE INSTRUCTIONS:   1. PCP f/u in 1 week 2. Dermatology f/u in 1 week for rash  DIET:   Cardiac diet  ACTIVITY:   Activity as tolerated  OXYGEN:   Home Oxygen: No.  Oxygen Delivery: room air  DISCHARGE LOCATION:   nursing home   If you experience worsening of your admission symptoms, develop shortness of breath, life threatening emergency, suicidal or homicidal thoughts you must seek medical attention immediately by calling 911 or calling your MD immediately  if symptoms less severe.  You Must read complete instructions/literature along with all the possible adverse reactions/side effects for all the Medicines you take and that have been prescribed to you. Take any new Medicines after you have completely understood  and accpet all the possible adverse reactions/side effects.   Please note  You were cared for by a hospitalist during your hospital stay. If you have any questions about your discharge medications or the care you received while you were in the hospital after you are discharged, you can call the unit and asked to speak with the hospitalist on call if the hospitalist that took care of you is not available. Once you are discharged, your primary care physician will handle any further medical issues. Please note that NO REFILLS for any discharge medications will be authorized once you are discharged, as it is imperative that you return to your primary care physician (or establish a relationship with a primary care physician if you do not have one) for your aftercare needs so that they can reassess  your need for medications and monitor your lab values.    On the day of Discharge:  VITAL SIGNS:   Blood pressure (!) 88/41, pulse (!) 45, temperature 97.8 F (36.6 C), temperature source Oral, resp. rate 18, height 5\' 2"  (1.575 m), weight 81.5 kg, SpO2 95 %.  PHYSICAL EXAMINATION:   GENERAL:  83 y.o.-year-old patient lying in the bed with no acute distress.  EYES: Pupils equal, round, reactive to light and accommodation. No scleral icterus. Extraocular muscles intact.  HEENT: Head atraumatic, normocephalic. Oropharynx and nasopharynx clear.  NECK:  Supple, no jugular venous distention. No thyroid enlargement, no tenderness.  LUNGS: Normal breath sounds bilaterally, no wheezing, rales,rhonchi or crepitation. No use of accessory muscles of respiration. Decreased bibasilar breath sounds CARDIOVASCULAR: S1, S2 normal. No  rubs, or gallops. 2/6 systolic murmur present. ABDOMEN: Soft, nontender, nondistended. Bowel sounds present. No organomegaly or mass.  EXTREMITIES: No pedal edema, cyanosis, or clubbing.  NEUROLOGIC: Cranial nerves II through XII are intact.  Motor and sensory strength intact, no focal  deficits. Global weakness noted. Gait not checked.  PSYCHIATRIC: The patient is alert and oriented x 1-2.  SKIN: No obvious  lesion, or ulcer.  Diffuse rash on her trunk up to the thighs.  None on the face noted.  Has slightly raised margins with lighter all over.  DATA REVIEW:   CBC Recent Labs  Lab 12/27/17 0330  WBC 11.8*  HGB 13.7  HCT 39.7  PLT 170    Chemistries  Recent Labs  Lab 12/22/17 1504  12/27/17 0330  NA 135   < > 138  K 3.9   < > 3.7  CL 100   < > 103  CO2 25   < > 31  GLUCOSE 115*   < > 88  BUN 27*   < > 19  CREATININE 0.56   < > 0.56  CALCIUM 8.2*   < > 8.0*  AST 30  --   --   ALT 32  --   --   ALKPHOS 68  --   --   BILITOT 1.1  --   --    < > = values in this interval not displayed.     Microbiology Results  Results for orders placed or performed during the hospital encounter of 12/22/17  C difficile quick scan w PCR reflex     Status: Abnormal   Collection Time: 12/22/17  1:13 PM  Result Value Ref Range Status   C Diff antigen POSITIVE (A) NEGATIVE Final   C Diff toxin POSITIVE (A) NEGATIVE Final    Comment: RESULT CALLED TO, READ BACK BY AND VERIFIED WITH: TONY BAWLES @1628  12/22/17 AKT    C Diff interpretation Toxin producing C. difficile detected.  Final    Comment: Performed at [facility], [address] [place], [place] [address]  Urine Culture     Status: Abnormal   Collection Time: 12/22/17  3:39 PM  Result Value Ref Range Status   Specimen Description   Final    URINE, RANDOM Performed at [facility], [address] [place], [place] [address]    Special Requests   Final    NONE Performed at [facility], [address] [place], [place] [address]    Culture >=100,000 COLONIES/mL KLEBSIELLA PNEUMONIAE (A)  Final   Report Status 12/24/2017 FINAL  Final   Organism ID, Bacteria KLEBSIELLA PNEUMONIAE (A)  Final      Susceptibility   Klebsiella pneumoniae -  MIC*    AMPICILLIN >=32 RESISTANT  Resistant     CEFAZOLIN <=4 SENSITIVE Sensitive     CEFTRIAXONE <=1 SENSITIVE Sensitive     CIPROFLOXACIN <=0.25 SENSITIVE Sensitive     GENTAMICIN <=1 SENSITIVE Sensitive     IMIPENEM <=0.25 SENSITIVE Sensitive     NITROFURANTOIN 64 INTERMEDIATE Intermediate     TRIMETH/SULFA <=20 SENSITIVE Sensitive     AMPICILLIN/SULBACTAM 4 SENSITIVE Sensitive     PIP/TAZO 8 SENSITIVE Sensitive     Extended ESBL NEGATIVE Sensitive     * >=100,000 COLONIES/mL KLEBSIELLA PNEUMONIAE  Blood culture (routine x 2)     Status: None   Collection Time: 12/22/17  4:46 PM  Result Value Ref Range Status   Specimen Description BLOOD LEFT HAND  Final   Special Requests   Final    BOTTLES DRAWN AEROBIC AND ANAEROBIC Blood Culture adequate volume   Culture   Final    NO GROWTH 5 DAYS Performed at [facility], [address] [place], [place] [address]    Report Status 12/27/2017 FINAL  Final  Blood culture (routine x 2)     Status: None   Collection Time: 12/22/17  5:54 PM  Result Value Ref Range Status   Specimen Description BLOOD BLOOD LEFT HAND  Final   Special Requests   Final    BOTTLES DRAWN AEROBIC AND ANAEROBIC Blood Culture results may not be optimal due to an inadequate volume of blood received in culture bottles   Culture   Final    NO GROWTH 5 DAYS Performed at [facility], [address] [place], [place] [address]    Report Status 12/27/2017 FINAL  Final  MRSA PCR Screening     Status: None   Collection Time: 12/22/17 10:56 PM  Result Value Ref Range Status   MRSA by PCR NEGATIVE NEGATIVE Final    Comment:        The GeneXpert MRSA Assay (FDA approved for NASAL specimens only), is one component of a comprehensive MRSA colonization surveillance program. It is not intended to diagnose MRSA infection nor to guide or monitor treatment for MRSA infections. Performed at [facility], [address] [place], [place] [address]     RADIOLOGY:  No results  found.   Management plans discussed with the patient, family and they are in agreement.  CODE STATUS:     Code Status Orders  (From admission, onward)         Start     Ordered   12/22/17 1901  Do not attempt resuscitation (DNR)  Continuous    Question Answer Comment  In the event of cardiac or respiratory ARREST Do not call a "code blue"   In the event of cardiac or respiratory ARREST Do not perform Intubation, CPR, defibrillation or ACLS   In the event of cardiac or respiratory ARREST Use medication by any route, position, wound care, and other measures to relive pain and suffering. May use oxygen, suction and manual treatment of airway obstruction as needed for comfort.      12/22/17 1900        Code Status History    Date Active Date Inactive Code Status Order ID Comments User Context   11/19/2017 0239 11/22/2017 2136 Full Code [id number]  Cammy Copa, MD Inpatient   11/27/2015 2202 11/29/2015 1557 Full Code [id number]  Oralia Manis, MD Inpatient    Advance Directive Documentation     Most Recent Value  Type of Advance Directive  Out of facility DNR (pink MOST or yellow form)  Pre-existing out of facility DNR order (yellow form or pink MOST form)  Physician notified to receive inpatient order  "MOST" Form in Place?  -      TOTAL TIME TAKING CARE OF THIS PATIENT: 38 minutes.    Enid Baas M.D on 12/29/2017 at 2:59 PM  Between 7am to 6pm - Pager - [phone]  After 6pm go to [email or web address] - Social research officer, government  [facility] [place] [facility]  Office  [phone]  CC: Primary care physician; System, Pcp Not In   Note: This dictation was prepared with Dragon dictation along with smaller phrase technology. Any transcriptional errors that result from this process are unintentional.

## 2017-12-29 NOTE — Clinical Social Work Note (Signed)
CSW received level 2 PASRR number today. Patient is medically ready for discharge today. CSW notified patient's nephew Ames Coupe [phone] of patient discharge to [facility] today. CSW also notified Ladona Ridgel at [facility] of discharge today. Patient will be transported by EMS. RN to call report and call for transport.   Ruthe Mannan MSW, [address] [phone]

## 2017-12-29 NOTE — Progress Notes (Signed)
Patient admitted with C. difficile colitis.  Awaiting to be discharged to [facility] rehab.  Level 2 PAS RR number was pending which we received today.  To discharge will happen today.  No changes to the discharge summary done on 12/28/2017. She does have diffuse rash on her body that has been present prior to admission.  Not an antibiotic allergic reaction.  Advised to see dermatology after discharge.

## 2017-12-29 NOTE — Progress Notes (Signed)
Pt's BP 119/59, HR 52 post bolus. Dr Nemiah Commander made aware and verbalized to discharge pt.  Pt is being discharged to [facility].  Report called to Karyl Kinnier, RN.   AVS, RX and DNR placed in discharge packet.  Awaiting EMS.

## 2018-01-02 ENCOUNTER — Other Ambulatory Visit: Payer: Self-pay

## 2018-01-02 MED ORDER — TRAMADOL HCL 50 MG PO TABS: 25.0000 mg | ORAL_TABLET | Freq: Three times a day (TID) | ORAL | 0 refills | Status: DC | PRN

## 2018-01-02 NOTE — Telephone Encounter (Signed)
Rx sent to [facility] phone : [phone] , fax : [phone]  

## 2018-01-03 ENCOUNTER — Encounter
Admission: RE | Admit: 2018-01-03 | Discharge: 2018-01-03 | Disposition: A | Discharge status: Home / Self Care | Payer: Medicare Other | Source: Ambulatory Visit | Attending: Internal Medicine | Admitting: Internal Medicine

## 2018-01-11 ENCOUNTER — Other Ambulatory Visit
Admission: RE | Admit: 2018-01-11 | Discharge: 2018-01-11 | Disposition: A | Discharge status: Home / Self Care | Payer: Medicare Other | Source: Ambulatory Visit | Attending: Adult Health | Admitting: Adult Health

## 2018-01-11 DIAGNOSIS — R197 Diarrhea, unspecified: Secondary | ICD-10-CM | POA: Insufficient documentation

## 2018-01-11 DIAGNOSIS — Z8619 Personal history of other infectious and parasitic diseases: Secondary | ICD-10-CM | POA: Diagnosis not present

## 2018-01-11 LAB — C DIFFICILE QUICK SCREEN W PCR REFLEX
C DIFFICILE (CDIFF) INTERP: DETECTED
C DIFFICILE (CDIFF) TOXIN: POSITIVE — AB
C Diff antigen: POSITIVE — AB

## 2018-01-12 ENCOUNTER — Non-Acute Institutional Stay (SKILLED_NURSING_FACILITY): Payer: Medicare Other | Admitting: Adult Health

## 2018-01-12 ENCOUNTER — Encounter: Payer: Self-pay | Admitting: Adult Health

## 2018-01-12 DIAGNOSIS — I10 Essential (primary) hypertension: Secondary | ICD-10-CM

## 2018-01-12 DIAGNOSIS — I482 Chronic atrial fibrillation, unspecified: Secondary | ICD-10-CM

## 2018-01-12 DIAGNOSIS — F01518 Vascular dementia, unspecified severity, with other behavioral disturbance: Secondary | ICD-10-CM

## 2018-01-12 DIAGNOSIS — F03918 Unspecified dementia, unspecified severity, with other behavioral disturbance: Secondary | ICD-10-CM

## 2018-01-12 DIAGNOSIS — F0151 Vascular dementia with behavioral disturbance: Secondary | ICD-10-CM | POA: Diagnosis not present

## 2018-01-12 DIAGNOSIS — A0471 Enterocolitis due to Clostridium difficile, recurrent: Secondary | ICD-10-CM

## 2018-01-12 DIAGNOSIS — F0391 Unspecified dementia with behavioral disturbance: Secondary | ICD-10-CM

## 2018-01-12 DIAGNOSIS — F339 Major depressive disorder, recurrent, unspecified: Secondary | ICD-10-CM

## 2018-01-12 NOTE — Progress Notes (Signed)
Location:   [place] [facility] Room Number: 205 A Place of Service:  SNF (31)   CODE STATUS: DNR  Allergies  Allergen Reactions  . Fish Allergy   . Shrimp [Shellfish Allergy]     Chief Complaint  Patient presents with  . Medical Management of Chronic Issues    Chronic afib; hypertension recurrent c-diff; dementia weekly follow up for the first 30 days post hospitalization.     HPI:  She is a 83 year old short term rehab patient being seen for the management of her chronic illnesses: chronic atrial fibrillation; hypertension; dementia; recurrent c-diff. She is still positive for c-diff. She is having less than 5 watery stools daily; there are no reports of fevers present; no complaint of abdominal pain no cough or shortness of breath  Past Medical History:  Diagnosis Date  . Anxiety disorder   . Cataracts, bilateral   . Dementia (HCC)   . Hypertension   . Major depressive disorder     Past Surgical History:  Procedure Laterality Date  . ABDOMINAL HYSTERECTOMY      Social History   Socioeconomic History  . Marital status: Widowed    Spouse name: Not on file  . Number of children: Not on file  . Years of education: Not on file  . Highest education level: Not on file  Occupational History  . Not on file  Social Needs  . Financial resource strain: Not on file  . Food insecurity:    Worry: Not on file    Inability: Not on file  . Transportation needs:    Medical: Not on file    Non-medical: Not on file  Tobacco Use  . Smoking status: Never Smoker  . Smokeless tobacco: Never Used  Substance and Sexual Activity  . Alcohol use: No  . Drug use: No  . Sexual activity: Not on file  Lifestyle  . Physical activity:    Days per week: Not on file    Minutes per session: Not on file  . Stress: Not on file  Relationships  . Social connections:    Talks on phone: Not on file    Gets together: Not on file    Attends religious service: Not on  file    Active member of club or organization: Not on file    Attends meetings of clubs or organizations: Not on file    Relationship status: Not on file  . Intimate partner violence:    Fear of current or ex partner: Not on file    Emotionally abused: Not on file    Physically abused: Not on file    Forced sexual activity: Not on file  Other Topics Concern  . Not on file  Social History Narrative  . Not on file   Family History  Problem Relation Age of Onset  . Alzheimer's disease Brother       VITAL SIGNS BP 128/73   Pulse 76   Temp 98.3 F (36.8 C)   Resp 18   Ht 5\' 2"  (1.575 m)   Wt 169 lb 11.2 oz (77 kg)   SpO2 96%   BMI 31.04 kg/m   Outpatient Encounter Medications as of 01/12/2018  Medication Sig  . acetaminophen (TYLENOL) 325 MG tablet Take 2 tablets (650 mg total) by mouth every 6 (six) hours as needed for mild pain (or Fever >/= 101).  Marland Kitchen acidophilus (RISAQUAD) CAPS capsule Take 1 capsule by mouth 2 (two) times daily.  Marland Kitchen  albuterol (PROVENTIL HFA;VENTOLIN HFA) 108 (90 Base) MCG/ACT inhaler Inhale 2 puffs into the lungs every 4 (four) hours as needed for wheezing or shortness of breath (or cough).  Marland Kitchen apixaban (ELIQUIS) 5 MG TABS tablet Take 1 tablet (5 mg total) by mouth 2 (two) times daily.  Marland Kitchen atenolol (TENORMIN) 50 MG tablet Take 50 mg by mouth daily.  . benzonatate (TESSALON PERLES) 100 MG capsule Take 1 capsule (100 mg total) by mouth every 6 (six) hours as needed for cough.  . cholecalciferol (VITAMIN D) 1000 units tablet Take 2,000 Units by mouth daily.  Marland Kitchen diltiazem (CARDIZEM CD) 120 MG 24 hr capsule Take 1 capsule (120 mg total) by mouth daily. Hold the medicine if heart rate is less than 60 or systolic blood pressure less than 100  . donepezil (ARICEPT) 10 MG tablet Take 1 tablet by mouth at bedtime.   Marland Kitchen lisinopril (PRINIVIL,ZESTRIL) 10 MG tablet Take 1 tablet (10 mg total) by mouth daily.  Marland Kitchen liver oil-zinc oxide (DESITIN) 40 % ointment Apply to peri-area  and buttocks three times daily and as needed for incontinence with skin irritation  . NON FORMULARY Diet Type: NAS  . nystatin cream (MYCOSTATIN) Apply to peri-area three times daily and as needed with incontinence episodes until resolved  . QUEtiapine (SEROQUEL) 25 MG tablet Take 12.5 mg by mouth 2 (two) times daily.   . sertraline (ZOLOFT) 25 MG tablet Take 1 tablet by mouth every other day.   Marland Kitchen Spacer/Aero-Holding Chambers DEVI 1 puff by Does not apply route every 4 (four) hours as needed.  . traMADol (ULTRAM) 50 MG tablet Take 0.5 tablets (25 mg total) by mouth every 8 (eight) hours as needed for moderate pain or severe pain.  . vitamin B-12 (CYANOCOBALAMIN) 250 MCG tablet Take 250 mcg by mouth daily.    No facility-administered encounter medications on file as of 01/12/2018.      SIGNIFICANT DIAGNOSTIC EXAMS  LABS REVIEWED: TODAY:   12-27-17: wbc 11.8; hgb 13.7; hc 39.7; mcv 92.6 plt 170; glucose 88; bun 19; creat 0.56; k+ 3.7; na++ 138; ca 8.0  01-11-18: c-diff: + antigen and toxin +    Review of Systems  Constitutional: Negative for malaise/fatigue.  Respiratory: Negative for cough and shortness of breath.   Cardiovascular: Negative for chest pain, palpitations and leg swelling.  Gastrointestinal: Positive for diarrhea. Negative for abdominal pain, constipation and heartburn.       Is having less than 5 stools per day and are liquid   Musculoskeletal: Negative for back pain, joint pain and myalgias.  Skin: Negative.   Neurological: Negative for dizziness.  Psychiatric/Behavioral: The patient is not nervous/anxious.     Physical Exam  Constitutional: She is oriented to person, place, and time. She appears well-developed and well-nourished. No distress.  Neck: No thyromegaly present.  Cardiovascular: Normal rate, regular rhythm, normal heart sounds and intact distal pulses.  Pulmonary/Chest: Effort normal and breath sounds normal. No respiratory distress.  Abdominal: Soft.  Bowel sounds are normal. She exhibits no distension. There is no tenderness.  Musculoskeletal: She exhibits no edema.  Is able to move all extremities   Lymphadenopathy:    She has no cervical adenopathy.  Neurological: She is alert and oriented to person, place, and time.  Skin: Skin is warm and dry. She is not diaphoretic.  Psychiatric: She has a normal mood and affect.     ASSESSMENT/ PLAN:  TODAY:   1. Chronic atrial fibrillation: heart rate is stable: will continue tenormin  50 mg daily and cardizme cd 120 mg daily for rate control and is on long term eliquis 5 mg twice daily   2. Essential hypertension, benign: is stable b/p 128/73: will continue tenormin 50 mg daily lisinopril 10 mg daily   3. Vascular dementia with behavioral disturbance: is without change: weight is 169 pounds; will continue aricept 10 mg nightly   4.  Recurrent major depressive disorder: is stable will continue zoloft 25 mg daily   5. Psychosis in elderly with behavioral disturbance: is stable will continue seroquel 12.5 mg twice daily   6. Recurrent clostridium difficile diarrhea: is worse: will continue probiotic twice daily will begin vancomycin 125 mg every 6 hours through 01-26-18; then every 12 hours through 11-1-9; then daily through 02-10-18; then every other day through 12-1-9.       MD is aware of resident's narcotic use and is in agreement with current plan of care. We will attempt to wean resident as apropriate   Synthia Innocent NP [facility]  Contact [phone] Monday through Friday 8am- 5pm  After hours call [phone]

## 2018-01-19 DIAGNOSIS — F0151 Vascular dementia with behavioral disturbance: Secondary | ICD-10-CM | POA: Insufficient documentation

## 2018-01-19 DIAGNOSIS — I10 Essential (primary) hypertension: Secondary | ICD-10-CM | POA: Insufficient documentation

## 2018-01-19 DIAGNOSIS — F0390 Unspecified dementia without behavioral disturbance: Secondary | ICD-10-CM | POA: Insufficient documentation

## 2018-01-19 DIAGNOSIS — A0471 Enterocolitis due to Clostridium difficile, recurrent: Secondary | ICD-10-CM | POA: Insufficient documentation

## 2018-01-19 DIAGNOSIS — F01518 Vascular dementia, unspecified severity, with other behavioral disturbance: Secondary | ICD-10-CM | POA: Insufficient documentation

## 2018-01-24 ENCOUNTER — Encounter: Payer: Self-pay | Admitting: Internal Medicine

## 2018-01-24 ENCOUNTER — Non-Acute Institutional Stay (SKILLED_NURSING_FACILITY): Payer: Medicare Other | Admitting: Adult Health

## 2018-01-24 DIAGNOSIS — F0391 Unspecified dementia with behavioral disturbance: Secondary | ICD-10-CM

## 2018-01-24 DIAGNOSIS — F0151 Vascular dementia with behavioral disturbance: Secondary | ICD-10-CM

## 2018-01-24 DIAGNOSIS — I10 Essential (primary) hypertension: Secondary | ICD-10-CM | POA: Diagnosis not present

## 2018-01-24 DIAGNOSIS — F03918 Unspecified dementia, unspecified severity, with other behavioral disturbance: Secondary | ICD-10-CM

## 2018-01-24 DIAGNOSIS — N390 Urinary tract infection, site not specified: Secondary | ICD-10-CM

## 2018-01-24 DIAGNOSIS — I482 Chronic atrial fibrillation, unspecified: Secondary | ICD-10-CM | POA: Diagnosis not present

## 2018-01-24 DIAGNOSIS — F339 Major depressive disorder, recurrent, unspecified: Secondary | ICD-10-CM

## 2018-01-24 DIAGNOSIS — A4150 Gram-negative sepsis, unspecified: Secondary | ICD-10-CM | POA: Diagnosis not present

## 2018-01-24 DIAGNOSIS — A0471 Enterocolitis due to Clostridium difficile, recurrent: Secondary | ICD-10-CM

## 2018-01-24 DIAGNOSIS — F01518 Vascular dementia, unspecified severity, with other behavioral disturbance: Secondary | ICD-10-CM

## 2018-01-24 NOTE — Progress Notes (Deleted)
Location:  [facility] Room Number: 205-A Place of Service:  SNF (31) Provider:  Kenard Gower, NP  Patient Care Team: System, Pcp Not In as PCP - General  Extended Emergency Contact Information Primary Emergency Contact: [facility] Address: [address]          [place], [place] [address] Darden Amber of [place] Home Phone: [phone] Mobile Phone: [phone] Relation: Nephew Secondary Emergency Contact: [facility] Phone: [phone] Relation: Other  Code Status:  DNR  Goals of care: Advanced Directive information Advanced Directives 01/12/2018  Does Patient Have a Medical Advance Directive? Yes  Type of Advance Directive Out of facility DNR (pink MOST or yellow form)  Does patient want to make changes to medical advance directive? No - Patient declined  Would patient like information on creating a medical advance directive? -  Pre-existing out of facility DNR order (yellow form or pink MOST form) Yellow form placed in chart (order not valid for inpatient use)     Chief Complaint  Patient presents with  . Discharge Note    Patient discharging to [facility]    HPI:  Pt is an 83 y.o. female seen today for discharge.  She is to transfer to [facility] 01/25/18.  She has a PMH of chronic atrial fibrillation, hypertension, recurrent C. difficile, and dementia.  Past Medical History:  Diagnosis Date  . Anxiety disorder   . Cataracts, bilateral   . Dementia (HCC)   . Hypertension   . Major depressive disorder    Past Surgical History:  Procedure Laterality Date  . ABDOMINAL HYSTERECTOMY      Allergies  Allergen Reactions  . Fish Allergy   . Shrimp [Shellfish Allergy]     Outpatient Encounter Medications as of 01/24/2018  Medication Sig  . acetaminophen (TYLENOL) 325 MG tablet Take 2 tablets (650 mg total) by mouth every 6 (six) hours as needed for mild pain (or Fever >/= 101).  Marland Kitchen acidophilus (RISAQUAD) CAPS  capsule Take 1 capsule by mouth 2 (two) times daily.  Marland Kitchen albuterol (PROVENTIL HFA;VENTOLIN HFA) 108 (90 Base) MCG/ACT inhaler Inhale 2 puffs into the lungs every 4 (four) hours as needed for wheezing or shortness of breath (or cough).  Marland Kitchen apixaban (ELIQUIS) 5 MG TABS tablet Take 1 tablet (5 mg total) by mouth 2 (two) times daily.  Marland Kitchen atenolol (TENORMIN) 50 MG tablet Take 50 mg by mouth daily.  . cholecalciferol (VITAMIN D) 1000 units tablet Take 2,000 Units by mouth daily.  Marland Kitchen diltiazem (CARDIZEM CD) 120 MG 24 hr capsule Take 1 capsule (120 mg total) by mouth daily. Hold the medicine if heart rate is less than 60 or systolic blood pressure less than 100  . donepezil (ARICEPT) 10 MG tablet Take 1 tablet by mouth at bedtime.   Marland Kitchen lisinopril (PRINIVIL,ZESTRIL) 10 MG tablet Take 1 tablet (10 mg total) by mouth daily.  Marland Kitchen liver oil-zinc oxide (DESITIN) 40 % ointment Apply to peri-area and buttocks three times daily and as needed for incontinence with skin irritation  . NON FORMULARY Diet Type: NAS  . nystatin cream (MYCOSTATIN) Apply to peri-area three times daily and as needed with incontinence episodes until resolved  . QUEtiapine (SEROQUEL) 25 MG tablet Take 12.5 mg by mouth 2 (two) times daily.   . sertraline (ZOLOFT) 25 MG tablet Take 1 tablet by mouth every other day.   Marland Kitchen Spacer/Aero-Holding Chambers DEVI 1 puff by Does not apply route every 4 (four) hours as needed.  Marland Kitchen  traMADol (ULTRAM) 50 MG tablet Take 0.5 tablets (25 mg total) by mouth every 8 (eight) hours as needed for moderate pain or severe pain.  Marland Kitchen VANCOMYCIN HCL PO Take 250 mg by mouth See admin instructions. Take q6h through 10/25. Take q12h x1 week 10/26-11/2 Take daily x1 week 11/2-11/9 Take every other day x23 days 11/10-12/02/19  . vitamin B-12 (CYANOCOBALAMIN) 250 MCG tablet Take 250 mcg by mouth daily.   . [DISCONTINUED] benzonatate (TESSALON PERLES) 100 MG capsule Take 1 capsule (100 mg total) by mouth every 6 (six) hours as needed  for cough.   No facility-administered encounter medications on file as of 01/24/2018.     Review of Systems  GENERAL: No change in appetite, no fatigue, no weight changes, no fever, chills or weakness SKIN: Denies rash, itching, wounds, ulcer sores, or nail abnormalities EYES: Denies change in vision, dry eyes, eye pain, itching or discharge EARS: Denies change in hearing, ringing in ears, or earache NOSE: Denies nasal congestion or epistaxis MOUTH and THROAT: Denies oral discomfort, gingival pain or bleeding, pain from teeth or hoarseness   RESPIRATORY: no cough, SOB, DOE, wheezing, hemoptysis CARDIAC: No chest pain, edema or palpitations GI: No abdominal pain, diarrhea, constipation, heart burn, nausea or vomiting GU: Denies dysuria, frequency, hematuria, incontinence, or discharge MUSCULOSKELETAL: Denies joint pain, muscle pain, back pain, restricted movement, or unusual weakness CIRCULATION: Denies claudication, edema of legs, varicosities, or cold extremities NEUROLOGICAL: Denies dizziness, syncope, numbness, or headache PSYCHIATRIC: Denies feelings of depression or anxiety. No report of hallucinations, insomnia, paranoia, or agitation ENDOCRINE: Denies polyphagia, polyuria, polydipsia, heat or cold intolerance HEME/LYMPH: Denies excessive bruising, petechia, enlarged lymph nodes, or bleeding problems IMMUNOLOGIC: Denies history of frequent infections, AIDS, or use of immunosuppressive agents   Immunization History  Administered Date(s) Administered  . Influenza Nasal 01/17/2015  . Influenza-Unspecified 12/30/2011, 12/21/2012, 12/28/2013, 01/17/2015, 01/23/2016  . Pneumococcal Conjugate-13 01/20/2016   Pertinent  Health Maintenance Due  Topic Date Due  . INFLUENZA VACCINE  02/13/2018 (Originally 11/03/2017)  . DEXA SCAN  01/15/2019 (Originally 06/26/2001)  . PNA vac Low Risk Adult (2 of 2 - PPSV23) 01/15/2019 (Originally 01/19/2017)      Vitals:   01/24/18 1517  BP: 124/67   Pulse: 76  Resp: 18  Temp: 97.9 F (36.6 C)  TempSrc: Oral  SpO2: 99%  Weight: 169 lb 11.2 oz (77 kg)  Height: 5\' 2"  (1.575 m)   Body mass index is 31.04 kg/m.  Physical Exam  GENERAL APPEARANCE: Well nourished. In no acute distress. Normal body habitus SKIN:  Skin is warm and dry. There are no suspicious lesions or rash HEAD: Normal in size and contour. No evidence of trauma EYES: Lids open and close normally. No blepharitis, entropion or ectropion. PERRL. Conjunctivae are clear and sclerae are white. Lenses are without opacity EARS: Pinnae are normal. Patient hears normal voice tunes of the examiner MOUTH and THROAT: Lips are without lesions. Oral mucosa is moist and without lesions. Tongue is normal in shape, size, and color and without lesions NECK: supple, trachea midline, no neck masses, no thyroid tenderness, no thyromegaly LYMPHATICS: No LAN in the neck, no supraclavicular LAN RESPIRATORY: Breathing is even & unlabored, BS CTAB CARDIAC: RRR, no murmur,no extra heart sounds, no edema GI: Abdomen soft, normal BS, no masses, no tenderness, no hepatomegaly, no splenomegaly MUSCULOSKELETAL: No deformities. Movement at each extremity is full and painless. Strength is 5/5 at each extremity. Back is without kyphosis or scoliosis CIRCULATION: Pedal pulses are 2+. There  is no edema of the legs, ankles and feet NEUROLOGICAL: There is no tremor. Speech is clear PSYCHIATRIC: Alert and oriented X 3. Affect and behavior are appropriate  Labs reviewed: Recent Labs    12/23/17 0518 12/24/17 0314 12/27/17 0330  NA 136 140 138  K 3.4* 3.7 3.7  CL 106 109 103  CO2 23 26 31   GLUCOSE 101* 94 88  BUN 19 17 19   CREATININE 0.47 0.49 0.56  CALCIUM 7.6* 7.9* 8.0*   Recent Labs    11/18/17 2101 12/22/17 1504  AST 35 30  ALT 22 32  ALKPHOS 55 68  BILITOT 1.8* 1.1  PROT 7.7 5.4*  ALBUMIN 4.0 2.4*   Recent Labs    11/18/17 2101  12/22/17 1504 12/23/17 0518 12/24/17 0314  12/27/17 0330  WBC 15.6*   < > 32.3* 19.1* 10.9 11.8*  NEUTROABS 9.8*  --  28.4*  --   --   --   HGB 15.0   < > 16.5* 14.4 14.3 13.7  HCT 43.4   < > 47.0 43.0 41.9 39.7  MCV 92.7   < > 92.9 96.0 94.2 92.6  PLT 144*   < > 222 158 181 170   < > = values in this interval not displayed.    Assessment/Plan ***   Family/ staff Communication: ***  Labs/tests ordered:  ***  Goals of care:   Discharge to [facility]   Kenard Gower, NP [facility] and Adult Medicine [phone] (Monday-Friday 8:00 a.m. - 5:00 p.m.) [phone] (after hours)

## 2018-01-25 ENCOUNTER — Other Ambulatory Visit: Payer: Self-pay | Admitting: Adult Health

## 2018-01-25 NOTE — Progress Notes (Signed)
Location:  [facility] Service:  SNF (31) Provider:  Kenard Gower, NP  Patient Care Team: System, Pcp Not In as PCP - General  Extended Emergency Contact Information Primary Emergency Contact: [facility] Address: [address]          [place], [place] [address] Darden Amber of [place] Home Phone: [phone] Mobile Phone: [phone] Relation: Nephew Secondary Emergency Contact: [facility] Phone: [phone] Relation: Other  Code Status:  DNR  Goals of care: Advanced Directive information Advanced Directives 01/24/2018  Does Patient Have a Medical Advance Directive? Yes  Type of Advance Directive Out of facility DNR (pink MOST or yellow form)  Does patient want to make changes to medical advance directive? No - Patient declined  Would patient like information on creating a medical advance directive? -  Pre-existing out of facility DNR order (yellow form or pink MOST form) -     Chief Complaint  Patient presents with  . Discharge Note    Patient is transferring to [facility]    HPI:  Pt is a 83 y.o. female seen today for discharge.  She will transfer to [facility] on 01/25/18.    She has been admitted to [facility] on [facility]/17 from a recent hospitalization due to C. difficile diarrhea and sepsis due to UTI.  Urine culture grew Klebsiella and was treated with Levaquin.  Blood culture was negative.  She was given vancomycin for C. difficile colitis for 10 days.  Repeat stool culture was positive for C. difficile so she was restarted on oral vancomycin. She has a PMH of chronic atrial fibrillation, hypertension, recurrent. C. difficile, and dementia.  Patient was admitted to this facility for short-term rehabilitation after the patient's recent hospitalization.     Past Medical History:  Diagnosis Date  . Anxiety disorder   . Cataracts, bilateral   . Dementia (HCC)   . Hypertension   . Major depressive  disorder    Past Surgical History:  Procedure Laterality Date  . ABDOMINAL HYSTERECTOMY      Allergies  Allergen Reactions  . Fish Allergy   . Shrimp [Shellfish Allergy]     Outpatient Encounter Medications as of 01/24/2018  Medication Sig  . acetaminophen (TYLENOL) 325 MG tablet Take 2 tablets (650 mg total) by mouth every 6 (six) hours as needed for mild pain (or Fever >/= 101).  Marland Kitchen acidophilus (RISAQUAD) CAPS capsule Take 1 capsule by mouth 2 (two) times daily.  Marland Kitchen albuterol (PROVENTIL HFA;VENTOLIN HFA) 108 (90 Base) MCG/ACT inhaler Inhale 2 puffs into the lungs every 4 (four) hours as needed for wheezing or shortness of breath (or cough).  Marland Kitchen apixaban (ELIQUIS) 5 MG TABS tablet Take 1 tablet (5 mg total) by mouth 2 (two) times daily.  Marland Kitchen atenolol (TENORMIN) 50 MG tablet Take 50 mg by mouth daily.  . cholecalciferol (VITAMIN D) 1000 units tablet Take 2,000 Units by mouth daily.  Marland Kitchen diltiazem (CARDIZEM CD) 120 MG 24 hr capsule Take 1 capsule (120 mg total) by mouth daily. Hold the medicine if heart rate is less than 60 or systolic blood pressure less than 100  . donepezil (ARICEPT) 10 MG tablet Take 1 tablet by mouth at bedtime.   Marland Kitchen lisinopril (PRINIVIL,ZESTRIL) 10 MG tablet Take 1 tablet (10 mg total) by mouth daily.  Marland Kitchen liver oil-zinc oxide (DESITIN) 40 % ointment Apply to peri-area and buttocks three times daily and as needed for incontinence with skin irritation  .  NON FORMULARY Diet Type: NAS  . nystatin cream (MYCOSTATIN) Apply to peri-area three times daily and as needed with incontinence episodes until resolved  . QUEtiapine (SEROQUEL) 25 MG tablet Take 12.5 mg by mouth 2 (two) times daily.   . sertraline (ZOLOFT) 25 MG tablet Take 1 tablet by mouth every other day.   Marland Kitchen Spacer/Aero-Holding Chambers DEVI 1 puff by Does not apply route every 4 (four) hours as needed.  . traMADol (ULTRAM) 50 MG tablet Take 0.5 tablets (25 mg total) by mouth every 8 (eight) hours as needed for moderate  pain or severe pain.  Marland Kitchen VANCOMYCIN HCL PO Take 250 mg by mouth See admin instructions. Take q6h through 10/25. Take q12h x1 week 10/26-11/2 Take daily x1 week 11/2-11/9 Take every other day x23 days 11/10-12/02/19  . vitamin B-12 (CYANOCOBALAMIN) 250 MCG tablet Take 250 mcg by mouth daily.    No facility-administered encounter medications on file as of 01/24/2018.     Review of Systems  GENERAL: No change in appetite, no fatigue, no weight changes, no fever, chills or weakness MOUTH and THROAT: Denies oral discomfort, gingival pain or bleeding RESPIRATORY: no cough, SOB, DOE, wheezing, hemoptysis CARDIAC: No chest pain, edema or palpitations GI: No abdominal pain, diarrhea, constipation, heart burn, nausea or vomiting GU: Denies dysuria, frequency, hematuria, or discharge PSYCHIATRIC: Denies feelings of depression or anxiety. No report of hallucinations, insomnia, paranoia, or agitation   Immunization History  Administered Date(s) Administered  . Influenza Nasal 01/17/2015  . Influenza-Unspecified 12/30/2011, 12/21/2012, 12/28/2013, 01/17/2015, 01/23/2016  . Pneumococcal Conjugate-13 01/20/2016   Pertinent  Health Maintenance Due  Topic Date Due  . INFLUENZA VACCINE  02/13/2018 (Originally 11/03/2017)  . DEXA SCAN  01/15/2019 (Originally 06/26/2001)  . PNA vac Low Risk Adult (2 of 2 - PPSV23) 01/15/2019 (Originally 01/19/2017)      Vitals:   01/25/18 0720  BP: 124/67  Pulse: 76  Resp: 18  Temp: 97.9 F (36.6 C)  TempSrc: Oral  SpO2: 99%  Weight: 169 lb 11.2 oz (77 kg)  Height: 5\' 2"  (1.575 m)   Body mass index is 31.04 kg/m.  Physical Exam  GENERAL APPEARANCE: Well nourished. In no acute distress. Obese SKIN:  Erythematous rashes on chest MOUTH and THROAT: Lips are without lesions. Oral mucosa is moist and without lesions. Tongue is normal in shape, size, and color and without lesions RESPIRATORY: Breathing is even & unlabored, BS CTAB CARDIAC: RRR, no murmur,no  extra heart sounds, no edema GI: Abdomen soft, normal BS, no masses, no tenderness EXTREMITIES:  Able to move X 4 extremities NEUROLOGICAL: There is no tremor. Speech is clear PSYCHIATRIC: Alert to self, disoriented to time and place. Affect and behavior are appropriate   Labs reviewed: Recent Labs    12/23/17 0518 12/24/17 0314 12/27/17 0330  NA 136 140 138  K 3.4* 3.7 3.7  CL 106 109 103  CO2 23 26 31   GLUCOSE 101* 94 88  BUN 19 17 19   CREATININE 0.47 0.49 0.56  CALCIUM 7.6* 7.9* 8.0*   Recent Labs    11/18/17 2101 12/22/17 1504  AST 35 30  ALT 22 32  ALKPHOS 55 68  BILITOT 1.8* 1.1  PROT 7.7 5.4*  ALBUMIN 4.0 2.4*   Recent Labs    11/18/17 2101  12/22/17 1504 12/23/17 0518 12/24/17 0314 12/27/17 0330  WBC 15.6*   < > 32.3* 19.1* 10.9 11.8*  NEUTROABS 9.8*  --  28.4*  --   --   --  HGB 15.0   < > 16.5* 14.4 14.3 13.7  HCT 43.4   < > 47.0 43.0 41.9 39.7  MCV 92.7   < > 92.9 96.0 94.2 92.6  PLT 144*   < > 222 158 181 170   < > = values in this interval not displayed.     Assessment/Plan  1. Recurrent Clostridium difficile diarrhea -repeat stool culture was positive for C. difficile, was restarted on Levaquin   2. Sepsis due to gram-negative UTI (HCC) -  completed Levaquin treatment   3. Essential hypertension, benign -  continue atenolol 50 mg 1 tab daily, diltiazem ER 120 mg  Daily, and lisinopril 10 mg 1 tab daily   4. Chronic atrial fibrillation - rate controlled, continue diltiazem ER 120 mg daily, atenolol 50 mg 1 tab daily and Eliquis 5 mg 1 tab twice a day   5. Recurrent major depressive disorder, remission status unspecified (HCC) -mood is stable, continue sertraline 25 mg 1 tab every other day   6. Vascular dementia with behavioral disturbance (HCC) -continue supportive care, fall precautions   7. 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   DME provided:  None  Total discharge time: Less than 30 minutes  Discharge time involved coordination of the discharge process with social worker, nursing staff and therapy department.    Kenard Gower, NP [facility] and Adult Medicine [phone] (Monday-Friday 8:00 a.m. - 5:00 p.m.) [phone] (after hours)

## 2018-01-25 NOTE — Progress Notes (Signed)
This encounter was created in error - please disregard.

## 2018-05-01 ENCOUNTER — Non-Acute Institutional Stay: Payer: Medicare Other | Admitting: Nurse Practitioner

## 2018-05-01 ENCOUNTER — Encounter: Payer: Self-pay | Admitting: Nurse Practitioner

## 2018-05-01 VITALS — HR 78 | Temp 97.0°F | Resp 18

## 2018-05-01 DIAGNOSIS — Z515 Encounter for palliative care: Secondary | ICD-10-CM | POA: Insufficient documentation

## 2018-05-01 DIAGNOSIS — R413 Other amnesia: Secondary | ICD-10-CM | POA: Insufficient documentation

## 2018-05-01 DIAGNOSIS — R531 Weakness: Secondary | ICD-10-CM

## 2018-05-01 NOTE — Progress Notes (Signed)
Community Pall[facility] Telephone: [phone] Fax: [phone]  PATIENT NAME: Dominique Patel DOB: [birth year] MRN: [id number]  PRIMARY CARE PROVIDER:   Keane Police, MD  REFERRING PROVIDER:  Dr Lorenda Hatchet [facility] RESPONSIBLE PARTY:   nephew Ames Coupe at [phone] or niece Brandt Loosen at [phone]   ASSESSMENT:     I visited observe [name]. We talked about purpose for palliative care visit. She verbalize agreement. We talked about how she was feeling today. She smiled and said that she's doing well. We talked about symptoms of pain and shortness of breath but she denies. We talked about her appetite and she verbalizes that it's too good. Ms Door talked about the food at [facility] and shared that if you want to lose weight that's not the place to come. The food is very good and she eats most everything she shared. We talked about Life review. She talked about her husband who has passed. She talked about living on her own with her dog who is now with a handicap friend. We talked about the work that she did in a [employer]. She talked about having a friend who owns the mail which made it easier for her to work there. [name] shared that she's only had two jobs in her lifetime. We talked about her recent hospitalization. She shared that now she's at [facility] to get stronger. We talked about her transition to long-term care after short-term rehab. She verbalizes that she would like to be moved to a room where she has a roommate who she can talk to. We talked about activities at [facility]. She shared it's a nice place to be. She was quite positive and smiling throughout palliative care visit. She was quite operative with assessment. Talk about role of palliative care and plan of care. Limited discussion due to some difficulty with cognition and understanding. We talked about palliative care following up in 4 weeks if needed or sooner should  she declined as she currently is asymptomatic. I called and left Zella Ball a message for update on palliative care visit. Updated nursing staff no new changes to current goals at present time.  Ms Yoke is nephew, Windy Fast returned my call. We talked about purpose for palliative care visit. Talked about visit with [name]. Clinical update discuss. We talked about the last time she's independent at home. We talked about past medical history in the sudden chronic disease and natural aging. We talked about symptoms which currently she is asymptomatic. We talked about appetite. We talked about progression of dementia. We talked about recent hospitalization with transition to short-term rehab and now long-term care. We talked about the difficulty in the loss of Independence and that [name] shared that she misses her dog. He talked about her love for her dog and that was a piece that was more difficult. We talked about residing at facility including activities. She is being transition to another room with a roommate per her request. She wanted to have more social interaction which should be very good for her. We talked about medical goals as she is a DNR. We talked about role of palliative care and plan of care. Discuss that will follow up in 4 weeks if needed or sooner should she declined. Therapeutic listening and emotional support provided. Questions answered the satisfaction. Contact information provided.  11 / 25 / 2019 wbc 6.6, hemoglobin 12.6, hemocrit 37.4, platelets 166, sodium 142, potassium 4.1, chloride  102, co2 29, calcium 8.6, bun 14.5, ceatinine 0.50, glucose 78  12 / 18 / 2018 albumin 3.2, total protein 6.1   RECOMMENDATIONS and PLAN:   1.  Palliative care encounter Z51.5; Palliative medicine team will continue to support patient, patient's family, and medical team. Visit consisted of counseling and education dealing with the complex and emotionally intense issues of symptom management and  palliative care in the setting of serious and potentially life-threatening illness  2. Generalized weakness R53.1  secondary to Dementia continue with restorative as able. Encourage energy conservation and rest times. Encourage social activities  3. Memory loss R41.3 secondary to dementia. Continue with supportive measures as chronic disease remains Progressive.  I spent 90 minutes providing this consultation,  from 1:30pm to 3:00pm. More than 50% of the time in this consultation was spent coordinating communication.   HISTORY OF PRESENT ILLNESS:  Dominique Patel is a 84 y.o. year old female with multiple medical problems including Vascular Dementia, atrial fibrillation, hypertension, cataracts, depression, anxiety, abdominal hysterectomy. Last hospitalization 9 / 19 / 2019 to 9 / 26 / 2019 for sepsis secondary to UTI with cultures growing klebsiella requiring antibiotic therapy. C-diff requiring oral Vancomycin. Diffuse rash thought to be a drug rash the later determined that she had prior to antibiotic therapy oh, hospitalization. She continued on Aricept for dementia. Chronic atrial fib requiring Eliquis and cardizem. She was a DNR this hospitalization. She was [facility] on 9/26 / 2019 for short-term rehab then [facility] care on 01/25/2018 where she currently resides. Primary provider 1 / 7 / 2020 advantage of short-term rehab after sepsis secondary to UTI which resolved. Last primary provider 04/20/2018 for her follow-up rash which has improved after receiving Diflucan. Alert and oriented with intermittent confusion. Able to transfer with gait belt and needs assistance with ADLs. Acknowledges when she needs to use the bathroom but times of incontinence bowel and bladder. She is able to feed herself after tray setup. She is able to make her needs verbally known. At present she is sitting in her room in the chair. She  appears comfortable. No visitors present. Palliative Care was asked to help address goals of care.   CODE STATUS: DNR  PPS: 40% HOSPICE ELIGIBILITY/DIAGNOSIS: TBD  PAST MEDICAL HISTORY:  Past Medical History:  Diagnosis Date  . Anxiety disorder   . Cataracts, bilateral   . Dementia (HCC)   . Hypertension   . Major depressive disorder     SOCIAL HX:  Social History   Tobacco Use  . Smoking status: Never Smoker  . Smokeless tobacco: Never Used  Substance Use Topics  . Alcohol use: No    ALLERGIES:  Allergies  Allergen Reactions  . Fish Allergy   . Shrimp [Shellfish Allergy]      PERTINENT MEDICATIONS:  Outpatient Encounter Medications as of 05/01/2018  Medication Sig  . acetaminophen (TYLENOL) 325 MG tablet Take 2 tablets (650 mg total) by mouth every 6 (six) hours as needed for mild pain (or Fever >/= 101).  Marland Kitchen. acidophilus (RISAQUAD) CAPS capsule Take 1 capsule by mouth 2 (two) times daily.  Marland Kitchen. albuterol (PROVENTIL HFA;VENTOLIN HFA) 108 (90 Base) MCG/ACT inhaler Inhale 2 puffs into the lungs every 4 (four) hours as needed for wheezing or shortness of breath (or cough).  Marland Kitchen. apixaban (ELIQUIS) 5 MG TABS tablet Take 1 tablet (5 mg total) by mouth 2 (two) times daily.  Marland Kitchen. atenolol (TENORMIN)  50 MG tablet Take 50 mg by mouth daily.  . cholecalciferol (VITAMIN D) 1000 units tablet Take 2,000 Units by mouth daily.  Marland Kitchen diltiazem (CARDIZEM CD) 120 MG 24 hr capsule Take 1 capsule (120 mg total) by mouth daily. Hold the medicine if heart rate is less than 60 or systolic blood pressure less than 100  . donepezil (ARICEPT) 10 MG tablet Take 1 tablet by mouth at bedtime.   Marland Kitchen lisinopril (PRINIVIL,ZESTRIL) 10 MG tablet Take 1 tablet (10 mg total) by mouth daily.  Marland Kitchen liver oil-zinc oxide (DESITIN) 40 % ointment Apply to peri-area and buttocks three times daily and as needed for incontinence with skin irritation  . NON FORMULARY Diet Type: NAS  . nystatin cream (MYCOSTATIN) Apply to peri-area  three times daily and as needed with incontinence episodes until resolved  . QUEtiapine (SEROQUEL) 25 MG tablet Take 12.5 mg by mouth 2 (two) times daily.   . sertraline (ZOLOFT) 25 MG tablet Take 1 tablet by mouth every other day.   Marland Kitchen Spacer/Aero-Holding Chambers DEVI 1 puff by Does not apply route every 4 (four) hours as needed.  . traMADol (ULTRAM) 50 MG tablet Take 0.5 tablets (25 mg total) by mouth every 8 (eight) hours as needed for moderate pain or severe pain.  Marland Kitchen VANCOMYCIN HCL PO Take 250 mg by mouth See admin instructions. Take q6h through 10/25. Take q12h x1 week 10/26-11/2 Take daily x1 week 11/2-11/9 Take every other day x23 days 11/10-12/02/19  . vitamin B-12 (CYANOCOBALAMIN) 250 MCG tablet Take 250 mcg by mouth daily.    No facility-administered encounter medications on file as of 05/01/2018.     PHYSICAL EXAM:   General: NAD, pleasant female Cardiovascular: regular rate and rhythm Pulmonary: clear ant fields Abdomen: soft, nontender, + bowel sounds GU: no suprapubic tenderness Extremities: no edema, no joint deformities Skin: no rashes Neurological: Weakness but otherwise nonfocal  Ilias Stcharles Prince Rome, NP

## 2018-07-21 ENCOUNTER — Non-Acute Institutional Stay: Payer: Medicare Other | Admitting: Primary Care

## 2018-07-21 ENCOUNTER — Other Ambulatory Visit: Payer: Self-pay

## 2018-07-21 DIAGNOSIS — R413 Other amnesia: Secondary | ICD-10-CM

## 2018-07-21 DIAGNOSIS — Z515 Encounter for palliative care: Secondary | ICD-10-CM

## 2018-07-21 NOTE — Progress Notes (Signed)
[facility]er[facility] Palliative Care Consult Note Telephone: [phone]  Fax: [phone]  TELEHEALTH VISIT STATEMENT Due to the COVID-19 crisis, this visit was done via telemedicine from my office. It was initiated and consented to by this patient and/or family.  PATIENT NAME: Dominique Patel DOB: 08/19/1934 MRN: [id number]  PRIMARY CARE PROVIDER:   Alvester Morin, MD  REFERRING PROVIDER:  Alvester Morin, MD [address], [place] [address]  RESPONSIBLE PARTY:   Extended Emergency Contact Information Primary Emergency Contact: [facility] Address: [address]          [place], [place] [address] Johnnette Litter of [place] Phone: [phone] Mobile Phone: [phone] Relation: Nephew Secondary Emergency Contact: [facility] Phone: [phone] Relation: Other  Met by video conference with Dorthula Perfect and facility RN, Evette Doffing.  ASSESSMENT and RECOMMENDATIONS:   1.Dementia/mood: Facilitate visit with family via video. Facility has not allowed visitors for a month now, due to  Covid. Patient states she has not visited with family over video. Video means will be offered to POA and contact for setting up with nursing home, so patient can have some visit time. SNF staff reports no behaviors or sundowning that are problematic for safety or s/sx of somatic issues.  2. Skin breakdown: None currently, preventative measures discussed with SNF staff for getting oob, bunny boots to heels. Patient states she can move her feet and does so for in bed exercise.  3. Activity: Staff will get her up for activities and meals, and to toilet. Patient has been spending more time in bed, discussed if she thought she could be up in a chair more. She said she could. She is at risk for skin breakdown, debility and respiratory complications if she is in bed too much. This intervention can be easily done and patient is in agreement.  4.  Goals of Care: DNR on record, I will upload DNR to [facility]. I will reach out to [facility],  to report visit and assess for questions or concerns. T/c to mobile number, house phone has been disconnected. No answer, message and number left.   Palliative care will continue to follow for goals of care clarification and symptom management. Return 4-6 weeks or prn.  I spent 25 minutes providing this consultation, from 1015 to 1040. More than 50% of the time in this consultation was spent coordinating communication.   HISTORY OF PRESENT ILLNESS:  Dominique Patel is a 82 y.o. 84 year old female with multiple medical problems including Vascular Dementia, atrial fibrillation, hypertension, cataracts, depression, anxiety. Palliative Care was asked to help address goals of care.   CODE STATUS: DNR PPS: 30% HOSPICE ELIGIBILITY/DIAGNOSIS: TBD  PAST MEDICAL HISTORY:  Past Medical History:  Diagnosis Date   Anxiety disorder    Cataracts, bilateral    Dementia ([place])    Hypertension    Major depressive disorder     SOCIAL HX:  Social History   Tobacco Use   Smoking status: Never Smoker   Smokeless tobacco: Never Used  Substance Use Topics   Alcohol use: No    ALLERGIES:  Allergies  Allergen Reactions   Fish Allergy    Shrimp [Shellfish Allergy]      PERTINENT MEDICATIONS:  Outpatient Encounter Medications as of 82/17/2020  Medication Sig   acetaminophen (TYLENOL) 325 MG tablet Take 2 tablets (650 mg total) by mouth every 6 (six) hours as needed for mild pain (or Fever >/= 101).   acidophilus (RISAQUAD) CAPS capsule  Take 1 capsule by mouth 2 (two) times daily.   albuterol (PROVENTIL HFA;VENTOLIN HFA) 108 (90 Base) MCG/ACT inhaler Inhale 2 puffs into the lungs every 4 (four) hours as needed for wheezing or shortness of breath (or cough).   apixaban (ELIQUIS) 5 MG TABS tablet Take 1 tablet (5 mg total) by mouth 2 (two) times daily.   atenolol (TENORMIN) 50 MG tablet  Take 50 mg by mouth daily.   cholecalciferol (VITAMIN D) 1000 units tablet Take 2,000 Units by mouth daily.   diltiazem (CARDIZEM CD) 120 MG 24 hr capsule Take 1 capsule (120 mg total) by mouth daily. Hold the medicine if heart rate is less than 60 or systolic blood pressure less than 100   donepezil (ARICEPT) 10 MG tablet Take 1 tablet by mouth at bedtime.    lisinopril (PRINIVIL,ZESTRIL) 10 MG tablet Take 1 tablet (10 mg total) by mouth daily.   liver oil-zinc oxide (DESITIN) 40 % ointment Apply to peri-area and buttocks three times daily and as needed for incontinence with skin irritation   NON FORMULARY Diet Type: NAS   nystatin cream (MYCOSTATIN) Apply to peri-area three times daily and as needed with incontinence episodes until resolved   QUEtiapine (SEROQUEL) 25 MG tablet Take 12.5 mg by mouth 2 (two) times daily.    sertraline (ZOLOFT) 25 MG tablet Take 1 tablet by mouth every other day.    Spacer/Aero-Holding Chambers DEVI 1 puff by Does not apply route every 4 (four) hours as needed.   traMADol (ULTRAM) 50 MG tablet Take 0.5 tablets (25 mg total) by mouth every 8 (eight) hours as needed for moderate pain or severe pain.   VANCOMYCIN HCL PO Take 250 mg by mouth See admin instructions. Take q6h through 10/25. Take q12h x1 week 10/26-11/2 Take daily x1 week 11/2-11/9 Take every other day x23 days 11/10-12/02/19   vitamin B-12 (CYANOCOBALAMIN) 250 MCG tablet Take 250 mcg by mouth daily.    No facility-administered encounter medications on file as of 82/17/2020.     PHYSICAL EXAM:   Weight unavailable General: NAD, frail appearing, thin, eating well Cardiovascular: denies chest pian Pulmonary: appears dyspnic at rest, discussed using nocturnal oxygen if she qualifies. Abdomen: appetite good, bm regular GU: incontinent Extremities: no edema,  Skin: no rashes or wounds- needs prevention Neurological: Weakness, stamina declininig  Cyndia Skeeters DNP, AGPCNP-BC

## 2018-08-23 ENCOUNTER — Other Ambulatory Visit: Payer: Self-pay

## 2018-08-23 ENCOUNTER — Non-Acute Institutional Stay: Payer: Medicare Other | Admitting: Primary Care

## 2018-08-30 ENCOUNTER — Other Ambulatory Visit: Payer: Self-pay

## 2018-08-30 ENCOUNTER — Non-Acute Institutional Stay: Payer: Medicare Other | Admitting: Primary Care

## 2018-08-30 DIAGNOSIS — Z515 Encounter for palliative care: Secondary | ICD-10-CM

## 2018-08-30 NOTE — Progress Notes (Signed)
Therapist, nutritional Palliative Care Consult Note Telephone: [phone]  Fax: [phone]  TELEHEALTH VISIT STATEMENT Due to the COVID-19 crisis, this visit was done via telemedicine from my office. It was initiated and consented to by this patient and/or family.  PATIENT NAME: Dominique Patel DOB: 10/07/1934 MRN: [id number]  PRIMARY CARE PROVIDER:   Keane Police, MD  REFERRING PROVIDER:  Keane Police, MD [phone] [place], [place] [address]  RESPONSIBLE PARTY: Extended Emergency Contact Information Primary Emergency Contact: [facility] Address: [address]          [place], [place] [address] Darden Amber of [place] Home Phone: [phone] Mobile Phone: [phone] Relation: Nephew Secondary Emergency Contact: [facility] Phone: [phone] Relation: Other  Palliative Care was asked to follow patient by consultation request of Dr.Slade-Hartman, Alvino Chapel, MD  . This is a follow up visit.  ASSESSMENT and RECOMMENDATIONS:   1.Dementia/mood:  Appears to have good mood, not able to video family members anymore due to lack of service. SNF staff reports no behaviors or sundowning that are problematic for safety or s/sx of somatic issues.  2. Skin breakdown: None currently, preventative measures discussed with SNF staff for getting oob, bunny boots to heels. Patient states she can move her feet and does so for in bed exercise.  3. Activity: Encouraged to be OOB in chair daily. Has pain in sacrum due to sitting, needs to recline off sacrum.   4. Goals of Care: DNR.  I will reach out to Family/POA, was unable to reach last month due to phone numbers disconnected. Palliative care will continue to follow for goals of care clarification and symptom management. Return 4 weeks or prn.  I spent 25 minutes providing this consultation,  from 1050 to 1115. More than 50% of the time in this consultation was spent coordinating  communication.   HISTORY OF PRESENT ILLNESS:  Dominique Patel is a 84 y.o. year old female with multiple medical problems including Vascular Dementia, atrial fibrillation, hypertension, cataracts, depression, anxiety. Palliative Care was asked to help address goals of care.   CODE STATUS: DNR, no most on file  PPS: 30% HOSPICE ELIGIBILITY/DIAGNOSIS: TBD  PAST MEDICAL HISTORY:  Past Medical History:  Diagnosis Date  . Anxiety disorder   . Cataracts, bilateral   . Dementia (HCC)   . Hypertension   . Major depressive disorder     SOCIAL HX:  Social History   Tobacco Use  . Smoking status: Never Smoker  . Smokeless tobacco: Never Used  Substance Use Topics  . Alcohol use: No    ALLERGIES:  Allergies  Allergen Reactions  . Fish Allergy   . Shrimp [Shellfish Allergy]      PERTINENT MEDICATIONS:  Outpatient Encounter Medications as of 08/30/2018  Medication Sig  . acetaminophen (TYLENOL) 325 MG tablet Take 2 tablets (650 mg total) by mouth every 6 (six) hours as needed for mild pain (or Fever >/= 101).  Marland Kitchen acidophilus (RISAQUAD) CAPS capsule Take 1 capsule by mouth 2 (two) times daily.  Marland Kitchen albuterol (PROVENTIL HFA;VENTOLIN HFA) 108 (90 Base) MCG/ACT inhaler Inhale 2 puffs into the lungs every 4 (four) hours as needed for wheezing or shortness of breath (or cough).  Marland Kitchen apixaban (ELIQUIS) 5 MG TABS tablet Take 1 tablet (5 mg total) by mouth 2 (two) times daily.  Marland Kitchen atenolol (TENORMIN) 50 MG tablet Take 50 mg by mouth daily.  . cholecalciferol (VITAMIN D) 1000 units tablet Take 2,000 Units by mouth daily.  Marland Kitchen  diltiazem (CARDIZEM CD) 120 MG 24 hr capsule Take 1 capsule (120 mg total) by mouth daily. Hold the medicine if heart rate is less than 60 or systolic blood pressure less than 100  . donepezil (ARICEPT) 10 MG tablet Take 1 tablet by mouth at bedtime.   Marland Kitchen. lisinopril (PRINIVIL,ZESTRIL) 10 MG tablet Take 1 tablet (10 mg total) by mouth daily.  Marland Kitchen. liver oil-zinc oxide (DESITIN) 40 %  ointment Apply to peri-area and buttocks three times daily and as needed for incontinence with skin irritation  . NON FORMULARY Diet Type: NAS  . nystatin cream (MYCOSTATIN) Apply to peri-area three times daily and as needed with incontinence episodes until resolved  . QUEtiapine (SEROQUEL) 25 MG tablet Take 12.5 mg by mouth 2 (two) times daily.   . sertraline (ZOLOFT) 25 MG tablet Take 1 tablet by mouth every other day.   Marland Kitchen. Spacer/Aero-Holding Chambers DEVI 1 puff by Does not apply route every 4 (four) hours as needed.  . traMADol (ULTRAM) 50 MG tablet Take 0.5 tablets (25 mg total) by mouth every 8 (eight) hours as needed for moderate pain or severe pain.  Marland Kitchen. VANCOMYCIN HCL PO Take 250 mg by mouth See admin instructions. Take q6h through 10/25. Take q12h x1 week 10/26-11/2 Take daily x1 week 11/2-11/9 Take every other day x23 days 11/10-12/02/19  . vitamin B-12 (CYANOCOBALAMIN) 250 MCG tablet Take 250 mcg by mouth daily.    No facility-administered encounter medications on file as of 08/30/2018.     PHYSICAL EXAM:   98.1-63-18-125/78 Wt 161.2  General: NAD, frail appearing, WNWD, states good appetite, pain in back/sacrum. Cardiovascular : denies chest pain Pulmonary: No cough no respiratory distress Abdomen: endorses good appetite GU: incontinent Extremities: no edema, no joint deformities , no falls Skin: wide spread rash being Rx with several preparations.Exists on buttock, under arms and groin. Is allergic to diaper material Neurological: Weakness, memory loss, vascular dementia deficits\ Dominique FileKathryn M Emilia Kayes DNP, AGPCNP-BC

## 2018-09-27 ENCOUNTER — Other Ambulatory Visit: Payer: Self-pay

## 2018-09-27 ENCOUNTER — Non-Acute Institutional Stay: Payer: Medicare Other | Admitting: Primary Care

## 2018-09-27 DIAGNOSIS — Z515 Encounter for palliative care: Secondary | ICD-10-CM

## 2018-09-27 NOTE — Progress Notes (Signed)
[facility] Palliative Care Consult Note Telephone: [phone]  Fax: [phone]  TELEHEALTH VISIT STATEMENT Due to the COVID-19 crisis, this visit was done via telemedicine from my office. It was initiated and consented to by this patient and/or family.  PATIENT NAME: Dominique Patel DOB: [birth year] MRN: [id number]  PRIMARY CARE PROVIDER:   Alvester Patel, [place]  REFERRING PROVIDER:  Alvester Morin, MD [phone] [address]. [place],  [place] [address] [phone]  RESPONSIBLE PARTY:   Extended Emergency Contact Information Primary Emergency Contact: Dominique Patel Address: [address]          [place], [place] [address] Dominique Patel of [place] Phone: [phone] Relation: Nephew Secondary Emergency Contact: Dominique Patel Mobile Phone: [phone] Relation: Niece  Palliative Care was asked to follow this patient by consultation request of Dominique Morin, MD. This is a follow up visit.  ASSESSMENT AND RECOMMENDATIONS:   1. Goals of Care: Maximize quality of life and symptom management.  2. Symptom Management:   Pain: Recommend ATC acetaminophen 500 mg q 8 hrs and increasing prn tramadol to  50 mg q 6 hr prn. She appears painful and has endorsed sacral pain several months now. States she can live with it but I encouraged staff to Rx her pain with tramadol.  Dyspnea: No increased DOE or SOB. Was Dx with covid + May 27, cleared June 11.   Depression: Recommend increasing Zoloft to 25 mg daily, and titrate as needed to relieve depression and aid pain management. Appears depressed, flat affect. Currently on Zoloft 25 mg  qod according to my records.   3. Family /Caregiver/Community Supports: T/c to POA and left message and number. I updated the emergency contacts and reached out to [place]. He returned call and I updated him on patient status. He was appreciative that I'd let him know that Dominique Patel was down  and he'd go for a window visit tonight. I asked him to call SNF to coordinate a visit.  4. Cognitive / Functional decline: Flatter affect today, seems depressed. Endorses pain, asked SNF staff to Rx. Also has not been as active, not OOB.  5. Advanced Care Directive: DNR on file, on Vynca  6. Follow up Palliative Care Visit: Palliative care will continue to follow for goals of care clarification and symptom management. Return 4-6 weeks or prn.  I spent 25 minutes providing this consultation,  from 1030 to 1055. More than 50% of the time in this consultation was spent coordinating communication.   HISTORY OF PRESENT ILLNESS:  Dominique Patel is a 84 y.o. year old female with multiple medical problems including Vascular Dementia, atrial fibrillation, hypertension, cataracts, depression, anxiety, s/p Covid infection. Palliative Care was asked to help address goals of care.   CODE STATUS: DNR  PPS: 30% HOSPICE ELIGIBILITY/DIAGNOSIS: TBD  PAST MEDICAL HISTORY:  Past Medical History:  Diagnosis Date  . Anxiety disorder   . Cataracts, bilateral   . Dementia ([place])   . Hypertension   . Major depressive disorder     SOCIAL HX:  Social History   Tobacco Use  . Smoking status: Never Smoker  . Smokeless tobacco: Never Used  Substance Use Topics  . Alcohol use: No    ALLERGIES:  Allergies  Allergen Reactions  . Fish Allergy   . Shrimp [Shellfish Allergy]      PERTINENT MEDICATIONS:  Outpatient Encounter Medications as of 09/27/2018  Medication Sig  . acetaminophen (TYLENOL) 325 MG tablet Take 2 tablets (650  mg total) by mouth every 6 (six) hours as needed for mild pain (or Fever >/= 101).  Marland Kitchen. acidophilus (RISAQUAD) CAPS capsule Take 1 capsule by mouth 2 (two) times daily.  Marland Kitchen. albuterol (PROVENTIL HFA;VENTOLIN HFA) 108 (90 Base) MCG/ACT inhaler Inhale 2 puffs into the lungs every 4 (four) hours as needed for wheezing or shortness of breath (or cough).  Marland Kitchen. apixaban (ELIQUIS) 5 MG TABS  tablet Take 1 tablet (5 mg total) by mouth 2 (two) times daily.  Marland Kitchen. atenolol (TENORMIN) 50 MG tablet Take 50 mg by mouth daily.  . cholecalciferol (VITAMIN D) 1000 units tablet Take 2,000 Units by mouth daily.  Marland Kitchen. diltiazem (CARDIZEM CD) 120 MG 24 hr capsule Take 1 capsule (120 mg total) by mouth daily. Hold the medicine if heart rate is less than 60 or systolic blood pressure less than 100  . donepezil (ARICEPT) 10 MG tablet Take 1 tablet by mouth at bedtime.   Marland Kitchen. lisinopril (PRINIVIL,ZESTRIL) 10 MG tablet Take 1 tablet (10 mg total) by mouth daily.  Marland Kitchen. liver oil-zinc oxide (DESITIN) 40 % ointment Apply to peri-area and buttocks three times daily and as needed for incontinence with skin irritation  . NON FORMULARY Diet Type: NAS  . nystatin cream (MYCOSTATIN) Apply to peri-area three times daily and as needed with incontinence episodes until resolved  . QUEtiapine (SEROQUEL) 25 MG tablet Take 12.5 mg by mouth 2 (two) times daily.   . sertraline (ZOLOFT) 25 MG tablet Take 1 tablet by mouth every other day.   Marland Kitchen. Spacer/Aero-Holding Chambers DEVI 1 puff by Does not apply route every 4 (four) hours as needed.  . traMADol (ULTRAM) 50 MG tablet Take 0.5 tablets (25 mg total) by mouth every 8 (eight) hours as needed for moderate pain or severe pain.  Marland Kitchen. VANCOMYCIN HCL PO Take 250 mg by mouth Patel admin instructions. Take q6h through 10/25. Take q12h x1 week 10/26-11/2 Take daily x1 week 11/2-11/9 Take every other day x23 days 11/10-12/02/19  . vitamin B-12 (CYANOCOBALAMIN) 250 MCG tablet Take 250 mcg by mouth daily.    No facility-administered encounter medications on file as of 09/27/2018.     PHYSICAL EXAM/ROS:   97.9-68-16 142/71 Current and past weights: no new weights since April 161.0 lb General: NAD, frail appearing,  Endorses back pain. Cardiovascular: no chest pain reported, no edema,  Pulmonary: no cough, no increased SOB Abdomen: appetite 50%, denies constipation, incontinent of bowel GU:  denies dysuria, incontinent of urine MSK: no joint deformities, bed bound, not OOB to chair b/c of covid. Skin: no rashes or wounds reported  Neurological: Weakness, but otherwise non-focal Psychiatric: No mood changes per staff but seems flatter today, became saddened when asked if she's spoke with family.  Marijo FileKathryn M Lou Irigoyen, DNP, AGPCNP-BC

## 2018-10-25 ENCOUNTER — Non-Acute Institutional Stay: Payer: Medicare Other | Admitting: Primary Care

## 2018-10-25 ENCOUNTER — Other Ambulatory Visit: Payer: Self-pay

## 2018-10-25 DIAGNOSIS — Z515 Encounter for palliative care: Secondary | ICD-10-CM

## 2018-10-25 NOTE — Progress Notes (Signed)
[facility] Palliative Care Consult Note Telephone: [phone]  Fax: [phone]  TELEHEALTH VISIT STATEMENT Due to the COVID-19 crisis, this visit was done via telemedicine from my office. It was initiated and consented to by this patient and/or family.  PATIENT NAME: Dominique Patel DOB: 01/26/1935 MRN: [id number]  PRIMARY CARE PROVIDER:   Alvester Morin, [place]  REFERRING PROVIDER:  Alvester Morin, MD [phone] [address] [place],  [place] [address] [phone]  RESPONSIBLE PARTY:   Extended Emergency Contact Information Primary Emergency Contact: Lillia Corporal Address: [address]          [place], [place] [address] Johnnette Litter of [place] Phone: [phone] Relation: Nephew Secondary Emergency Contact: Delton See Mobile Phone: [phone] Relation: Niece  Palliative Care was asked to follow this patient by consultation request of Alvester Morin, MD. This is a follow up visit.  ASSESSMENT AND RECOMMENDATIONS:   1. Goals of Care: Maximize quality of life and symptom management.  2. Symptom Management:   Pain: Recommend ATC acetaminophen 650 mg q 6 hrs  AROUND the clock, and reinstating tramadol to  50 mg q 6 hr prn. Still states pain as 9/10 on occasion, please reinstate prn and ATC tylenol for improving mood and bed mobility.  Pain in sacrum from sitting, encouraged to change positions, encouraged to call staff for repositioning. On Tylenol prn as well.  Nutrition: Has lost 7 lbs in 3 months. Recommend med pass for nutritional supplementation  3. Family /Caregiver/Community Supports: Has niece and nephew as PR, has an outside room for window visit. Activities had a visit with family which she enjoyed.   4. Cognitive / Functional decline: Cognitively at baseline, having PT for muscle weakness.  5. Advanced Care Directive: DNR on chart, VYNCA.  6. Follow up Palliative Care Visit: Palliative  care will continue to follow for goals of care clarification and symptom management. Return 6 weeks or prn.  I spent 25 minutes providing this consultation,  from 1030 to 1055. More than 50% of the time in this consultation was spent coordinating communication.   HISTORY OF PRESENT ILLNESS:  Dominique Patel is a 84 y.o. year old female with multiple medical problems including Vascular Dementia, atrial fibrillation, hypertension, cataracts, depression, anxiety, s/p Covid infection. Palliative Care was asked to help address goals of care.   CODE STATUS: DNR  PPS: 30% HOSPICE ELIGIBILITY/DIAGNOSIS: TBD  PAST MEDICAL HISTORY:  Past Medical History:  Diagnosis Date  . Anxiety disorder   . Cataracts, bilateral   . Dementia ([place])   . Hypertension   . Major depressive disorder     SOCIAL HX:  Social History   Tobacco Use  . Smoking status: Never Smoker  . Smokeless tobacco: Never Used  Substance Use Topics  . Alcohol use: No    ALLERGIES:  Allergies  Allergen Reactions  . Fish Allergy   . Shrimp [Shellfish Allergy]      PERTINENT MEDICATIONS:  Outpatient Encounter Medications as of 10/25/2018  Medication Sig  . acetaminophen (TYLENOL) 325 MG tablet Take 2 tablets (650 mg total) by mouth every 6 (six) hours as needed for mild pain (or Fever >/= 101).  Marland Kitchen acidophilus (RISAQUAD) CAPS capsule Take 1 capsule by mouth 2 (two) times daily.  Marland Kitchen albuterol (PROVENTIL HFA;VENTOLIN HFA) 108 (90 Base) MCG/ACT inhaler Inhale 2 puffs into the lungs every 4 (four) hours as needed for wheezing or shortness of breath (or cough).  Marland Kitchen apixaban (ELIQUIS) 5 MG TABS tablet Take  1 tablet (5 mg total) by mouth 2 (two) times daily.  Marland Kitchen. atenolol (TENORMIN) 50 MG tablet Take 50 mg by mouth daily.  . cholecalciferol (VITAMIN D) 1000 units tablet Take 2,000 Units by mouth daily.  Marland Kitchen. diltiazem (CARDIZEM CD) 120 MG 24 hr capsule Take 1 capsule (120 mg total) by mouth daily. Hold the medicine if heart rate is less than  60 or systolic blood pressure less than 100  . donepezil (ARICEPT) 10 MG tablet Take 1 tablet by mouth at bedtime.   Marland Kitchen. lisinopril (PRINIVIL,ZESTRIL) 10 MG tablet Take 1 tablet (10 mg total) by mouth daily.  Marland Kitchen. liver oil-zinc oxide (DESITIN) 40 % ointment Apply to peri-area and buttocks three times daily and as needed for incontinence with skin irritation  . NON FORMULARY Diet Type: NAS  . nystatin cream (MYCOSTATIN) Apply to peri-area three times daily and as needed with incontinence episodes until resolved  . QUEtiapine (SEROQUEL) 25 MG tablet Take 12.5 mg by mouth 2 (two) times daily.   . sertraline (ZOLOFT) 25 MG tablet Take 1 tablet by mouth every other day.   Marland Kitchen. Spacer/Aero-Holding Chambers DEVI 1 puff by Does not apply route every 4 (four) hours as needed.  . traMADol (ULTRAM) 50 MG tablet Take 0.5 tablets (25 mg total) by mouth every 8 (eight) hours as needed for moderate pain or severe pain.  Marland Kitchen. VANCOMYCIN HCL PO Take 250 mg by mouth See admin instructions. Take q6h through 10/25. Take q12h x1 week 10/26-11/2 Take daily x1 week 11/2-11/9 Take every other day x23 days 11/10-12/02/19  . vitamin B-12 (CYANOCOBALAMIN) 250 MCG tablet Take 250 mcg by mouth daily.    No facility-administered encounter medications on file as of 10/25/2018.     PHYSICAL EXAM/ROS:   Current and past weights: April  2020 161.0 lb, July  2020 = 153.2 lb, Epic chart shows wt as 176 lb in 11/2017. Recent 8 lb. (5%) weight loss, 15 lbs over year (9%).  General: NAD, frail appearing Cardiovascular: no chest pain reported, no cyanosis. Pulmonary: no cough, no increased SOB, Room air, Was Dx with covid + May 27, cleared June 11, no sequelae evident. Abdomen: appetite 26-50%, denies constipation, incontinent of bowel GU: denies dysuria, incontinent of urine MSK:  no joint deformities, stays in bed mostly, doing PT for improving mobility and due to muscle weakness. will stop Aug 7.  Skin: rash to peri area, on nystatin  and diflucan daily, allergic to diaper material.States rash is improving  But has f/u with dermatology on 10/30/2018. Neurological: Weakness, dementia, FAST 6e, h/o hallucinations, no current behaviors.   Marijo FileKathryn M Foy Mungia DNP AGPCNP-BC

## 2018-11-28 ENCOUNTER — Non-Acute Institutional Stay: Payer: Medicare Other | Admitting: Primary Care

## 2018-11-28 ENCOUNTER — Other Ambulatory Visit: Payer: Self-pay

## 2018-11-28 DIAGNOSIS — Z515 Encounter for palliative care: Secondary | ICD-10-CM

## 2018-11-28 NOTE — Progress Notes (Signed)
[facility] Palliative Care Consult Note Telephone: [phone]  Fax: [phone]  TELEHEALTH VISIT STATEMENT Due to the COVID-19 crisis, this visit was done via telemedicine from my office. It was initiated and consented to by this patient and/or family.  PATIENT NAME: Dominique Patel DOB: Apr 05, 1935 MRN: [id number]  PRIMARY CARE PROVIDER:   Alvester Morin, MD, [place] [address] [phone]  REFERRING PROVIDER:  Alvester Morin, MD [address] [place],  [place] [address] [phone]  RESPONSIBLE PARTY:   Extended Emergency Contact Information Primary Emergency Contact: Lillia Corporal Address: [address]          [place], [place] [address] Johnnette Litter of [place] Phone: [phone] Relation: Nephew Secondary Emergency Contact: Delton See Mobile Phone: [phone] Relation: Niece   ASSESSMENT AND RECOMMENDATIONS:   1. Advance Care Planning/Goals of Care: Goals include to maximize quality of life and symptom management. Has advance directives. Nursing home to move towards [facility] but must be signed by SNF MD.   2. Symptom Management:   Nutrition: Med pass started in July, increased weight in interim 2 months. She does not have a large appetite but takes the med pass.  Mood: Patient states mood is positive. Staff states she is in a good mood, sleeping well. She does not participate in activities but is not unhappy. Her family has not been there recently but can do window visits when possible.   3. Family /Caregiver/Community Supports: Has nephew who is POA. Called to advise him of NP visit. No answer, I left a message re her status and to let them know about window visits. Invited him to return call.   4. Cognitive / Functional decline: Does not appear to be in decline. I will continue to monitor. Less frequent visits (q 2-3 months) unless staff identifies decline.  5. Follow up  Palliative Care Visit: Palliative care will continue to follow for goals of care clarification and symptom management. Return 8-10 weeks or prn.  I spent 25 minutes providing this consultation,  from 1125 to 1150. More than 50% of the time in this consultation was spent coordinating communication.   HISTORY OF PRESENT ILLNESS:  Dominique Patel is a 84 y.o. year old female with multiple medical problems including dementia, weight loss. Palliative Care was asked to follow this patient by consultation request of Slade-Hartman, Ivette Loyal* to help address advance care planning and goals of care. This is a follow up visit.  CODE STATUS:  DNR  PPS: 30% HOSPICE ELIGIBILITY/DIAGNOSIS: TBD  PAST MEDICAL HISTORY:  Past Medical History:  Diagnosis Date  . Anxiety disorder   . Cataracts, bilateral   . Dementia ([place])   . Hypertension   . Major depressive disorder     SOCIAL HX:  Social History   Tobacco Use  . Smoking status: Never Smoker  . Smokeless tobacco: Never Used  Substance Use Topics  . Alcohol use: No    ALLERGIES:  Allergies  Allergen Reactions  . Fish Allergy   . Shrimp [Shellfish Allergy]      PERTINENT MEDICATIONS:  Outpatient Encounter Medications as of 11/28/2018  Medication Sig  . acetaminophen (TYLENOL) 325 MG tablet Take 2 tablets (650 mg total) by mouth every 6 (six) hours as needed for mild pain (or Fever >/= 101).  Marland Kitchen acidophilus (RISAQUAD) CAPS capsule Take 1 capsule by mouth 2 (two) times daily.  Marland Kitchen albuterol (PROVENTIL HFA;VENTOLIN HFA) 108 (90 Base) MCG/ACT inhaler Inhale 2 puffs into the  lungs every 4 (four) hours as needed for wheezing or shortness of breath (or cough).  Marland Kitchen. apixaban (ELIQUIS) 5 MG TABS tablet Take 1 tablet (5 mg total) by mouth 2 (two) times daily.  Marland Kitchen. atenolol (TENORMIN) 50 MG tablet Take 50 mg by mouth daily.  . cholecalciferol (VITAMIN D) 1000 units tablet Take 2,000 Units by mouth daily.  Marland Kitchen. diltiazem (CARDIZEM CD) 120 MG 24 hr capsule Take 1  capsule (120 mg total) by mouth daily. Hold the medicine if heart rate is less than 60 or systolic blood pressure less than 100  . donepezil (ARICEPT) 10 MG tablet Take 1 tablet by mouth at bedtime.   Marland Kitchen. lisinopril (PRINIVIL,ZESTRIL) 10 MG tablet Take 1 tablet (10 mg total) by mouth daily.  Marland Kitchen. liver oil-zinc oxide (DESITIN) 40 % ointment Apply to peri-area and buttocks three times daily and as needed for incontinence with skin irritation  . NON FORMULARY Diet Type: NAS  . nystatin cream (MYCOSTATIN) Apply to peri-area three times daily and as needed with incontinence episodes until resolved  . QUEtiapine (SEROQUEL) 25 MG tablet Take 12.5 mg by mouth 2 (two) times daily.   . sertraline (ZOLOFT) 25 MG tablet Take 1 tablet by mouth every other day.   Marland Kitchen. Spacer/Aero-Holding Chambers DEVI 1 puff by Does not apply route every 4 (four) hours as needed.  . traMADol (ULTRAM) 50 MG tablet Take 0.5 tablets (25 mg total) by mouth every 8 (eight) hours as needed for moderate pain or severe pain.  Marland Kitchen. VANCOMYCIN HCL PO Take 250 mg by mouth See admin instructions. Take q6h through 10/25. Take q12h x1 week 10/26-11/2 Take daily x1 week 11/2-11/9 Take every other day x23 days 11/10-12/02/19  . vitamin B-12 (CYANOCOBALAMIN) 250 MCG tablet Take 250 mcg by mouth daily.    No facility-administered encounter medications on file as of 11/28/2018.     PHYSICAL EXAM / ROS:  128/74/  97.2  72-28 Current and past weights: 155.6 lbs. In August 2020,  162.2 in Feb 2020, has gained since July. General: NAD, frail appearing Cardiovascular: no chest pain reported, no edema Pulmonary: no cough, no increased SOB, Room air Abdomen: appetite fair, taking med pass, denies constipation, incontinent of bowel GU: denies dysuria, incontinent of urine MSK:  no joint deformities, non ambulatory, prefers to stay in bed. Skin: no rashes or wounds reported Neurological: Weakness, A and O x 3, intermittent confusion  Marijo FileKathryn M Mehki Klumpp DNP  AGPCNP-BC

## 2019-01-23 ENCOUNTER — Non-Acute Institutional Stay: Payer: Medicare Other | Admitting: Primary Care

## 2019-01-23 ENCOUNTER — Other Ambulatory Visit: Payer: Self-pay

## 2019-01-25 ENCOUNTER — Non-Acute Institutional Stay: Payer: Medicare Other | Admitting: Primary Care

## 2019-01-25 ENCOUNTER — Other Ambulatory Visit: Payer: Self-pay

## 2019-01-25 DIAGNOSIS — Z515 Encounter for palliative care: Secondary | ICD-10-CM

## 2019-01-25 NOTE — Progress Notes (Signed)
[facility] Palliative Care Consult Note Telephone: [phone]  Fax: [phone]  TELEHEALTH VISIT STATEMENT  Due to the COVID-19 crisis, this visit was done via telemedicine from my office. It was initiated and consented to by this patient and/or family.  PATIENT NAME: Dominique Patel [address] [place] [address] [phone] (home)  DOB: 05/11/1934 MRN: [id number]  PRIMARY CARE PROVIDER:   Alvester Morin, MD, [place] [address] [phone]  REFERRING PROVIDER:  Alvester Morin, MD [address]. [place],  [place] [address] [phone]  RESPONSIBLE PARTY:   Extended Emergency Contact Information Primary Emergency Contact: Lillia Corporal Address: [address]          [place], [place] [address] Johnnette Litter of [place] Phone: [phone] Relation: Nephew Secondary Emergency Contact: Delton See Mobile Phone: [phone] Relation: Niece  ASSESSMENT AND RECOMMENDATIONS:   1. Advance Care Planning/Goals of Care: Goals include to maximize quality of life and symptom management. Has DNR on file.  2. Symptom Management:   Nutrition: Doing well, gaining weight. Had been on Medpass, currently gaining weight and Recommend d/c as she's gaining weight and eating well.  Mood: Stays in a good mood, no depression noted. States no family visits. She does not take part in activities.  Pain: Denies pain.  Constipation: Denies constipation.  3. Family /Caregiver/Community Supports: Has nephew and niece who do not visit often. 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 Lives in [place].  4. Cognitive / Functional decline: Oriented x 2, pleasant and interactive. Able to assist with feeding. Dependent in most adls.  5. Follow up Palliative Care Visit: Palliative care will continue to follow for goals of care clarification and symptom management. Return 8-10 weeks  or prn.  I spent 25 minutes providing this consultation,  from 1315  to 1340. More than 50% of the time in this consultation was spent coordinating communication.   HISTORY OF PRESENT ILLNESS:  Dominique Patel is a 84 y.o. year old female with multiple medical problems including dementia, anxiety. Palliative Care was asked to follow this patient by consultation request of Slade-Hartman, Ivette Loyal* to help address advance care planning and goals of care. This is a follow up visit.  CODE STATUS: DNR  PPS: 30% HOSPICE ELIGIBILITY/DIAGNOSIS: no  PAST MEDICAL HISTORY:  Past Medical History:  Diagnosis Date  . Anxiety disorder   . Cataracts, bilateral   . Dementia ([place])   . Hypertension   . Major depressive disorder     SOCIAL HX:  Social History   Tobacco Use  . Smoking status: Never Smoker  . Smokeless tobacco: Never Used  Substance Use Topics  . Alcohol use: No    ALLERGIES:  Allergies  Allergen Reactions  . Fish Allergy   . Shrimp [Shellfish Allergy]      PERTINENT MEDICATIONS:  Outpatient Encounter Medications as of 01/25/2019  Medication Sig  . acetaminophen (TYLENOL) 325 MG tablet Take 2 tablets (650 mg total) by mouth every 6 (six) hours as needed for mild pain (or Fever >/= 101).  Marland Kitchen acidophilus (RISAQUAD) CAPS capsule Take 1 capsule by mouth 2 (two) times daily.  Marland Kitchen albuterol (PROVENTIL HFA;VENTOLIN HFA) 108 (90 Base) MCG/ACT inhaler Inhale 2 puffs into the lungs every 4 (four) hours as needed for wheezing or shortness of breath (or cough).  Marland Kitchen apixaban (ELIQUIS) 5 MG TABS tablet Take 1 tablet (5 mg total) by mouth 2 (two) times daily.  Marland Kitchen  atenolol (TENORMIN) 50 MG tablet Take 50 mg by mouth daily.  . cholecalciferol (VITAMIN D) 1000 units tablet Take 2,000 Units by mouth daily.  Marland Kitchen diltiazem (CARDIZEM CD) 120 MG 24 hr capsule Take 1 capsule (120 mg total) by mouth daily. Hold the medicine if heart rate is less than 60 or systolic blood pressure less than 100  . donepezil  (ARICEPT) 10 MG tablet Take 1 tablet by mouth at bedtime.   Marland Kitchen lisinopril (PRINIVIL,ZESTRIL) 10 MG tablet Take 1 tablet (10 mg total) by mouth daily.  Marland Kitchen liver oil-zinc oxide (DESITIN) 40 % ointment Apply to peri-area and buttocks three times daily and as needed for incontinence with skin irritation  . NON FORMULARY Diet Type: NAS  . nystatin cream (MYCOSTATIN) Apply to peri-area three times daily and as needed with incontinence episodes until resolved  . QUEtiapine (SEROQUEL) 25 MG tablet Take 12.5 mg by mouth 2 (two) times daily.   . sertraline (ZOLOFT) 25 MG tablet Take 1 tablet by mouth every other day.   Marland Kitchen Spacer/Aero-Holding Chambers DEVI 1 puff by Does not apply route every 4 (four) hours as needed.  . traMADol (ULTRAM) 50 MG tablet Take 0.5 tablets (25 mg total) by mouth every 8 (eight) hours as needed for moderate pain or severe pain.  Marland Kitchen VANCOMYCIN HCL PO Take 250 mg by mouth See admin instructions. Take q6h through 10/25. Take q12h x1 week 10/26-11/2 Take daily x1 week 11/2-11/9 Take every other day x23 days 11/10-12/02/19  . vitamin B-12 (CYANOCOBALAMIN) 250 MCG tablet Take 250 mcg by mouth daily.    No facility-administered encounter medications on file as of 01/25/2019.     PHYSICAL EXAM / ROS:   98.2-70-18 110/72   Current and past weights: 157.4, wt gain General: NAD, frail appearing, WNWD Cardiovascular: no chest pain reported, no edema,  Pulmonary: no cough, no increased SOB Abdomen: appetite good, denies constipation, incontinent of bowel GU: denies dysuria, incontinent of urine MSK:  no joint deformities, non ambulatory, no falls,  Skin: no rashes or wounds reported Neurological: Weakness, mild cognitive impairment   Eliezer Lofts, NP

## 2019-03-28 ENCOUNTER — Non-Acute Institutional Stay: Payer: Medicare Other | Admitting: Primary Care

## 2019-03-28 ENCOUNTER — Other Ambulatory Visit: Payer: Self-pay

## 2019-03-28 DIAGNOSIS — Z515 Encounter for palliative care: Secondary | ICD-10-CM

## 2019-03-28 NOTE — Progress Notes (Signed)
Designer, jewellery Palliative Care Consult Note Telephone: [phone]  Fax: [phone]  TELEHEALTH VISIT STATEMENT Due to the COVID-19 crisis, this visit was done via telemedicine from my office. It was initiated and consented to by this patient and/or family.  PATIENT NAME: Dominique Patel [address] [place] [address] [phone] (home)  DOB: Aug 29, 1934 MRN: [id number]  PRIMARY CARE PROVIDER:   Alvester Morin, MD, [place] [address] [phone]  REFERRING PROVIDER:  Alvester Morin, MD [place]. [place],  [place] [address] [phone]  RESPONSIBLE PARTY:   Extended Emergency Contact Information Primary Emergency Contact: Lillia Corporal Address: [address]          [place], [place] [address] Johnnette Litter of [place] Phone: [phone] Relation: Nephew Secondary Emergency Contact: Delton See Mobile Phone: [phone] Relation: Niece   ASSESSMENT AND RECOMMENDATIONS:   1. Advance Care Planning/Goals of Care: Goals include to maximize quality of life and symptom management. POA is nephew Jori Moll. Has DNR on record.  2. Symptom Management:   Mood: Depression per staff report and patient statement she is not interested in things. Taking sertraline 75 mg daily for depression.  Nutrition: Edentulous, but states she has dentures.  Needs assessment of diet and dentures. Supplements given but encouraged to give more due to weight loss.   Mobility: PT working with her, does not like to get up. States she does not do many activities but looks at [employer]. Talks with roommate.   Rash: In groin, improved with topical Rx. Patient states sometimes it's itchy but does not bother her significantly.   Pain: Denies pain.  3. Family /Caregiver/Community Supports: POA is nephew. Alert and oriented x 1. Interactive.  4. Cognitive / Functional decline: Conversant but has memory loss. She is able to feed  self, some adls. No iadls.  5. Follow up Palliative Care Visit: Palliative care will continue to follow for goals of care clarification and symptom management. Return 4-6 weeks or prn.  I spent 1100 minutes providing this consultation,  from 1100 to 1125. More than 50% of the time in this consultation was spent coordinating communication.   HISTORY OF PRESENT ILLNESS:  Dominique Patel is a 84 y.o. year old female with multiple medical problems including Vascular Dementia, atrial fibrillation, hypertension, cataracts, depression, anxiety, s/p Covid infection.. Palliative Care was asked to follow this patient by consultation request of Slade-Hartman, Ivette Loyal* to help address advance care planning and goals of care. This is a follow up visit.  CODE STATUS: DNR  PPS: 30% HOSPICE ELIGIBILITY/DIAGNOSIS: TBD  PAST MEDICAL HISTORY:  Past Medical History:  Diagnosis Date  . Anxiety disorder   . Cataracts, bilateral   . Dementia ([facility])   . Hypertension   . Major depressive disorder     SOCIAL HX:  Social History   Tobacco Use  . Smoking status: Never Smoker  . Smokeless tobacco: Never Used  Substance Use Topics  . Alcohol use: No    ALLERGIES:  Allergies  Allergen Reactions  . Fish Allergy   . Shrimp [Shellfish Allergy]      PERTINENT MEDICATIONS:  Outpatient Encounter Medications as of 03/28/2019  Medication Sig  . acetaminophen (TYLENOL) 325 MG tablet Take 2 tablets (650 mg total) by mouth every 6 (six) hours as needed for mild pain (or Fever >/= 101).  Marland Kitchen acidophilus (RISAQUAD) CAPS capsule Take 1 capsule by mouth 2 (two) times daily.  Marland Kitchen albuterol (PROVENTIL HFA;VENTOLIN HFA) 108 (90 Base)  MCG/ACT inhaler Inhale 2 puffs into the lungs every 4 (four) hours as needed for wheezing or shortness of breath (or cough).  Marland Kitchen apixaban (ELIQUIS) 5 MG TABS tablet Take 1 tablet (5 mg total) by mouth 2 (two) times daily.  Marland Kitchen atenolol (TENORMIN) 50 MG tablet Take 50 mg by mouth daily.  .  cholecalciferol (VITAMIN D) 1000 units tablet Take 2,000 Units by mouth daily.  Marland Kitchen diltiazem (CARDIZEM CD) 120 MG 24 hr capsule Take 1 capsule (120 mg total) by mouth daily. Hold the medicine if heart rate is less than 60 or systolic blood pressure less than 100  . donepezil (ARICEPT) 10 MG tablet Take 1 tablet by mouth at bedtime.   Marland Kitchen lisinopril (PRINIVIL,ZESTRIL) 10 MG tablet Take 1 tablet (10 mg total) by mouth daily.  Marland Kitchen liver oil-zinc oxide (DESITIN) 40 % ointment Apply to peri-area and buttocks three times daily and as needed for incontinence with skin irritation  . NON FORMULARY Diet Type: NAS  . nystatin cream (MYCOSTATIN) Apply to peri-area three times daily and as needed with incontinence episodes until resolved  . QUEtiapine (SEROQUEL) 25 MG tablet Take 12.5 mg by mouth 2 (two) times daily.   . sertraline (ZOLOFT) 25 MG tablet Take 1 tablet by mouth every other day.   Marland Kitchen Spacer/Aero-Holding Chambers DEVI 1 puff by Does not apply route every 4 (four) hours as needed.  . traMADol (ULTRAM) 50 MG tablet Take 0.5 tablets (25 mg total) by mouth every 8 (eight) hours as needed for moderate pain or severe pain.  Marland Kitchen VANCOMYCIN HCL PO Take 250 mg by mouth See admin instructions. Take q6h through 10/25. Take q12h x1 week 10/26-11/2 Take daily x1 week 11/2-11/9 Take every other day x23 days 11/10-12/02/19  . vitamin B-12 (CYANOCOBALAMIN) 250 MCG tablet Take 250 mcg by mouth daily.    No facility-administered encounter medications on file as of 03/28/2019.    PHYSICAL EXAM / ROS:   Current and past weights: Dec 2020 150 lbs. Jan 2020 = 162. Lbs. April  2020 161.0 lb, July  2020 = 153.2 lb, Epic chart shows wt as 176 lb in 11/2017. weight loss, 12 lbs over year (9%).  General: NAD, frail appearing, thin Cardiovascular: no chest pain reported, no edema  Pulmonary: no cough, no increased SOB , room air Abdomen: appetite fair, wt loss continues. On med pass, denies constipation, incontinent of  bowel GU: denies dysuria, incontinent of urine MSK:  no joint deformities, non ambulatory Skin: Groin rash, rx with cream Neurological: Weakness,FAST score 6E , hallucinations, sleep is good  Eliezer Lofts, NP [facility]

## 2019-05-23 ENCOUNTER — Non-Acute Institutional Stay: Payer: Medicare Other | Admitting: Primary Care

## 2019-05-23 ENCOUNTER — Other Ambulatory Visit: Payer: Self-pay

## 2019-05-23 DIAGNOSIS — Z515 Encounter for palliative care: Secondary | ICD-10-CM

## 2019-05-23 NOTE — Progress Notes (Signed)
Therapist, nutritional Palliative Care Consult Note Telephone: [phone]  Fax: [phone]  TELEHEALTH VISIT STATEMENT Due to the COVID-19 crisis, this visit was done via telemedicine from my office. It was initiated and consented to by this patient and/or family.  PATIENT NAME: Dominique Patel [address] [place] [address] [phone] (home)  DOB: March 24, 1935 MRN: [id number]  PRIMARY CARE PROVIDER:   Keane Police, MD, [phone] [place] [address] [phone]  REFERRING PROVIDER:  Keane Police, MD [phone] [address] [place],  [place] [address] [phone]  RESPONSIBLE PARTY:   Extended Emergency Contact Information Primary Emergency Contact: Ames Coupe Address: [address]          [place], [place] [address] [place] of [place] Phone: [phone] Relation: Nephew Secondary Emergency Contact: Brandt Loosen Mobile Phone: [phone] Relation: Niece   ASSESSMENT AND RECOMMENDATIONS:   1. Advance Care Planning/Goals of Care: Goals include to maximize quality of life and symptom management. Pt has DNR.  2. Symptom Management:   Fungal rash: Continues to persist despite triple therapy. Recommend Interdry dressing fabric for use in addition. This will help resolve the chronic infection and promote comfort. Treatment nurse may be a resource for this item.  Constipation: No bm x 2 days, recommend miralax prn if no bm every other day.  Denies ongoing constipation but also a painful evacuation today.  Pain/ itching: Appears not to be a problem now. Has been intermittent due to rash. As above, suggest Interdry to help decrease the favorable environment for fungal proliferation.  Nutrition: Continues to lose weight although eating well. Getting medpass for weight loss. She has had a  9 % loss since  April 2020. This rate is being maintained. She weighs 147 now and was 162 lbs in April 2020.  Skin  integrity: Continues to have intermittent rash. Having triple therapy now. Recommend Inter-dry to assist with environment to decrease fungal growth.  Mood: Is tearful today due to a gift from a friend. She states she is in a good mood but happy b/c someone sent her a gift.   3. Family /Caregiver/Community Supports: Has nephew as POA and no other family in the area. Lives in LTC.   4. Cognitive / Functional decline:  Alert, oriented. At functional baseline which is most of her time in bed. Needs help with all iadls, many adls.  5. Follow up Palliative Care Visit: Palliative care will continue to follow for goals of care clarification and symptom management. Return 6 weeks or prn.  I spent 25 minutes providing this consultation,  from 1100 to 1125. More than 50% of the time in this consultation was spent coordinating communication.   HISTORY OF PRESENT ILLNESS:  Dominique Patel is a 85 y.o. year old female with multiple medical problems including dementia, depression,  anxiety, persistent fungal infection, abnormal weight loss. Palliative Care was asked to follow this patient by consultation request of Slade-Hartman, Alvino Chapel* to help address advance care planning and goals of care. This is a follow up visit.  CODE STATUS: DNR  PPS: 30% HOSPICE ELIGIBILITY/DIAGNOSIS: TBD  PAST MEDICAL HISTORY:  Past Medical History:  Diagnosis Date  . Anxiety disorder   . Cataracts, bilateral   . Dementia ([facility])   . Hypertension   . Major depressive disorder     SOCIAL HX:  Social History   Tobacco Use  . Smoking status: Never Smoker  . Smokeless tobacco: Never Used  Substance Use Topics  . Alcohol  use: No    ALLERGIES:  Allergies  Allergen Reactions  . Fish Allergy   . Shrimp [Shellfish Allergy]      PERTINENT MEDICATIONS:  Outpatient Encounter Medications as of 05/23/2019  Medication Sig  . acetaminophen (TYLENOL) 325 MG tablet Take 2 tablets (650 mg total) by mouth every 6 (six) hours as  needed for mild pain (or Fever >/= 101).  Marland Kitchen acidophilus (RISAQUAD) CAPS capsule Take 1 capsule by mouth 2 (two) times daily.  Marland Kitchen albuterol (PROVENTIL HFA;VENTOLIN HFA) 108 (90 Base) MCG/ACT inhaler Inhale 2 puffs into the lungs every 4 (four) hours as needed for wheezing or shortness of breath (or cough).  Marland Kitchen apixaban (ELIQUIS) 5 MG TABS tablet Take 1 tablet (5 mg total) by mouth 2 (two) times daily.  Marland Kitchen atenolol (TENORMIN) 50 MG tablet Take 50 mg by mouth daily.  . cholecalciferol (VITAMIN D) 1000 units tablet Take 2,000 Units by mouth daily.  Marland Kitchen diltiazem (CARDIZEM CD) 120 MG 24 hr capsule Take 1 capsule (120 mg total) by mouth daily. Hold the medicine if heart rate is less than 60 or systolic blood pressure less than 100  . donepezil (ARICEPT) 10 MG tablet Take 1 tablet by mouth at bedtime.   Marland Kitchen lisinopril (PRINIVIL,ZESTRIL) 10 MG tablet Take 1 tablet (10 mg total) by mouth daily.  Marland Kitchen liver oil-zinc oxide (DESITIN) 40 % ointment Apply to peri-area and buttocks three times daily and as needed for incontinence with skin irritation  . NON FORMULARY Diet Type: NAS  . nystatin cream (MYCOSTATIN) Apply to peri-area three times daily and as needed with incontinence episodes until resolved  . QUEtiapine (SEROQUEL) 25 MG tablet Take 12.5 mg by mouth 2 (two) times daily.   . sertraline (ZOLOFT) 25 MG tablet Take 1 tablet by mouth every other day.   Marland Kitchen Spacer/Aero-Holding Chambers DEVI 1 puff by Does not apply route every 4 (four) hours as needed.  . traMADol (ULTRAM) 50 MG tablet Take 0.5 tablets (25 mg total) by mouth every 8 (eight) hours as needed for moderate pain or severe pain.  Marland Kitchen VANCOMYCIN HCL PO Take 250 mg by mouth See admin instructions. Take q6h through 10/25. Take q12h x1 week 10/26-11/2 Take daily x1 week 11/2-11/9 Take every other day x23 days 11/10-12/02/19  . vitamin B-12 (CYANOCOBALAMIN) 250 MCG tablet Take 250 mcg by mouth daily.    No facility-administered encounter medications on file as  of 05/23/2019.    PHYSICAL EXAM / ROS:  [id number] 103/66  Current and past weights: 147 lbs, Dec 2020 150 lbs. Jan 2020 = 162. Lbs. April2020161.0 lb, July 2020 = 153.2 lb, Epic chart shows wt as 176 lb in 11/2017. weight loss, 9% weight loss in 10 months.  General: NAD, frail appearing, WNWD Cardiovascular: no chest pain reported, no edema,  Pulmonary: no cough, no increased SOB, room air Abdomen: appetite good, endorses constipation, incontinent of bowel GU: denies dysuria, incontinent of urine MSK:  no joint deformities, non -ambulatory, does not want to get OOB Skin: persistent fungal infection, on ketoconazole zeasorb (miconozol)  And diflucan daily Neurological: Weakness, mood is good, staff denies hallucinations.  Jason Coop, NP [facility]

## 2019-07-18 ENCOUNTER — Non-Acute Institutional Stay: Payer: Medicare Other | Admitting: Primary Care

## 2019-07-18 ENCOUNTER — Other Ambulatory Visit: Payer: Self-pay

## 2019-07-18 DIAGNOSIS — Z515 Encounter for palliative care: Secondary | ICD-10-CM

## 2019-07-18 NOTE — Progress Notes (Signed)
[place] Consult Note Telephone: [phone]  Fax: [phone]    PATIENT NAME: Dominique Patel [address] [place] [address] [phone] (home)  DOB: 1934-09-03 MRN: [id number]  PRIMARY CARE PROVIDER:   Alvester Morin, MD, [place]. [place] [address] [phone]  REFERRING PROVIDER:  Alvester Morin, MD [place]. [place],  [place] [address] [phone]  RESPONSIBLE PARTY:   Extended Emergency Contact Information Primary Emergency Contact: Dominique Patel Address: [address]          [place], [place] [address] Dominique Patel of [place] Phone: [phone] Relation: Nephew Secondary Emergency Contact: Dominique Patel Mobile Phone: [phone] Relation: Niece   I met with patient in the facility.  ASSESSMENT AND RECOMMENDATIONS:   1. Advance Care Planning/Goals of Care: Goals include to maximize quality of life and symptom management.DNR on file. Nephew is POA. She is angry with him but this may be part of her hallucinations.  2. Symptom Management:   Mood: She is more agitated and appears to have more hallucinations than previously. These seem distressing as she is stating she is being targeted by the devil,  Needs to sue people who are hurting her. Recommend re evaluation by psych to assess if she can benefit from any changes in medications. Also monitor  frequently for constipation or UTI which could be worsening sensorium.  Nutrition: Did not eat lunch today, Continues to lose weight. 143 today,   12% weight loss in 1 year.  Recommend her being hand fed to Patel if she will gain weight back. Recommend supplements as tolerated.   3. Family /Caregiver/Community Supports: Nephew is poa, she was married but had no children. Lives in [place].  4. Cognitive / Functional decline: Increased hallucinations, weight loss.  5. Follow up Palliative Care Visit: Palliative care will  continue to follow for goals of care clarification and symptom management. Return 4-6 weeks or prn.  I spent 25 minutes providing this consultation,  from 1030 to 1055. More than 50% of the time in this consultation was spent coordinating communication.   HISTORY OF PRESENT ILLNESS:  Dominique Patel is a 85 y.o. year old female with multiple medical problems including dementia, depression,  anxiety, persistent fungal infection, abnormal weight loss. Palliative Care was asked to follow this patient by consultation request of Dominique Patel, Dominique Patel* to help address advance care planning and goals of care. This is a follow up visit.  CODE STATUS: DNR  PPS: 30% HOSPICE ELIGIBILITY/DIAGNOSIS: TBD  PAST MEDICAL HISTORY:  Past Medical History:  Diagnosis Date  . Anxiety disorder   . Cataracts, bilateral   . Dementia ([place])   . Hypertension   . Major depressive disorder     SOCIAL HX:  Social History   Tobacco Use  . Smoking status: Never Smoker  . Smokeless tobacco: Never Used  Substance Use Topics  . Alcohol use: No    ALLERGIES:  Allergies  Allergen Reactions  . Fish Allergy   . Shrimp [Shellfish Allergy]      PERTINENT MEDICATIONS:  Outpatient Encounter Medications as of 07/18/2019  Medication Sig  . acetaminophen (TYLENOL) 325 MG tablet Take 2 tablets (650 mg total) by mouth every 6 (six) hours as needed for mild pain (or Fever >/= 101).  Marland Kitchen albuterol (PROVENTIL HFA;VENTOLIN HFA) 108 (90 Base) MCG/ACT inhaler Inhale 2 puffs into the lungs every 4 (four) hours as needed for wheezing or shortness of breath (or cough).  Marland Kitchen apixaban (ELIQUIS)  5 MG TABS tablet Take 1 tablet (5 mg total) by mouth 2 (two) times daily.  Marland Kitchen atenolol (TENORMIN) 50 MG tablet Take 25 mg by mouth daily.   . cholecalciferol (VITAMIN D) 1000 units tablet Take 2,000 Units by mouth daily.  Marland Kitchen diltiazem (CARDIZEM CD) 120 MG 24 hr capsule Take 1 capsule (120 mg total) by mouth daily. Hold the medicine if heart rate  is less than 60 or systolic blood pressure less than 100  . donepezil (ARICEPT) 10 MG tablet Take 1 tablet by mouth at bedtime.   . fluconazole (DIFLUCAN) 100 MG tablet Take 100 mg by mouth daily.  Marland Kitchen lisinopril (PRINIVIL,ZESTRIL) 10 MG tablet Take 1 tablet (10 mg total) by mouth daily. (Patient taking differently: Take 5 mg by mouth daily. )  . liver oil-zinc oxide (DESITIN) 40 % ointment Apply to peri-area and buttocks three times daily and as needed for incontinence with skin irritation  . nystatin cream (MYCOSTATIN) Apply to peri-area three times daily and as needed with incontinence episodes until resolved  . polyethylene glycol (MIRALAX / GLYCOLAX) 17 g packet Take 17 g by mouth daily.  . sertraline (ZOLOFT) 25 MG tablet Take 100 mg by mouth every other day.   Marland Kitchen Spacer/Aero-Holding Chambers DEVI 1 puff by Does not apply route every 4 (four) hours as needed.  . vitamin B-12 (CYANOCOBALAMIN) 250 MCG tablet Take 250 mcg by mouth daily.   . NON FORMULARY Diet Type: NAS  . [DISCONTINUED] acidophilus (RISAQUAD) CAPS capsule Take 1 capsule by mouth 2 (two) times daily.  . [DISCONTINUED] QUEtiapine (SEROQUEL) 25 MG tablet Take 12.5 mg by mouth 2 (two) times daily.   . [DISCONTINUED] traMADol (ULTRAM) 50 MG tablet Take 0.5 tablets (25 mg total) by mouth every 8 (eight) hours as needed for moderate pain or severe pain.   No facility-administered encounter medications on file as of 07/18/2019.    PHYSICAL EXAM / ROS:   Current and past weights: 143 lbs General: NAD, frail appearing, Cardiovascular: no chest pain reported, no edema,  Pulmonary: no cough, no increased SOB, room air.  Abdomen: appetite fair, denies constipation, incontinent of bowel GU: denies dysuria, incontinent of urine MSK:  no joint deformities, non ambulatory Skin: no rashes or wounds reported Neurological: Weakness, hallucinations, agitated from these, has doll for comfort, named Jake.  Dominique Coop, NP  [facility]  COVID-19 PATIENT SCREENING TOOL  Person answering questions: ____________Staff_______ _____   1.  Is the patient or any family member in the home showing any signs or symptoms regarding respiratory infection?               Person with Symptom- __________NA_________________  a. Fever                                                                          Yes___ No___          ___________________  b. Shortness of breath  Yes___ No___          ___________________ c. Cough/congestion                                       Yes___  No___         ___________________ d. Body aches/pains                                                         Yes___ No___        ____________________ e. Gastrointestinal symptoms (diarrhea, nausea)           Yes___ No___        ____________________  2. Within the past 14 days, has anyone living in the home had any contact with someone with or under investigation for COVID-19?    Yes___ No_X_   Person __________________   

## 2019-08-29 ENCOUNTER — Other Ambulatory Visit: Payer: Self-pay

## 2019-08-29 ENCOUNTER — Non-Acute Institutional Stay: Payer: Medicare Other | Admitting: Primary Care

## 2019-08-29 DIAGNOSIS — Z515 Encounter for palliative care: Secondary | ICD-10-CM

## 2019-08-29 DIAGNOSIS — F01518 Vascular dementia, unspecified severity, with other behavioral disturbance: Secondary | ICD-10-CM

## 2019-08-29 NOTE — Progress Notes (Signed)
[facility] Consult Note Telephone: [phone]  Fax: [phone]  PATIENT NAME: Dominique Patel [address] [place] [address] [id number] (home)  DOB: Jun 13, 1934 MRN: [id number]  PRIMARY CARE PROVIDER:    Alvester Morin, MD,  [address] [place] [address] [phone]  REFERRING PROVIDER:   Alvester Morin, MD [address] [place],  [place] [address] [phone]  RESPONSIBLE PARTY:   Extended Emergency Contact Information Primary Emergency Contact: Lillia Corporal Address: [address]          [place], [place] [address] Johnnette Litter of [place] Phone: [phone] Relation: Nephew Secondary Emergency Contact: Delton See Mobile Phone: [phone] Relation: Niece  I met with patient in the facility.  ASSESSMENT AND RECOMMENDATIONS:   1. Advance Care Planning/Goals of Care: Goals include to maximize quality of life and symptom management. DNR on file. Discussed some recent decline with staff. Pt has been periodically more agitated but then will have periods of more baseline behavior.  2. Symptom Management:   Agitation: Has had seroquel for agitation, 25 mg bid. This has been ordered for about 2 weeks and seems to have good effect. She had had bouts of increased agitation and had to have im ativan x 1. Today she has calm and appropriate interactions.  3. Family /Caregiver/Community Supports:  Nephew is [employer], lives in [place]. T/c to nephew Jori Moll to discuss any concerns, no answer, message left to return call if needed.   4. Cognitive / Functional decline: staff reports increased agitation and refusing care.  5. Follow up Palliative Care Visit: Palliative care will continue to follow for goals of care clarification and symptom management. Return 8 weeks or prn.  I spent 35 minutes providing this consultation,  from 1600 to 1635. More than 50% of the time in this consultation was  spent coordinating communication.   HISTORY OF PRESENT ILLNESS:  Dominique Patel is a 85 y.o. year old female with multiple medical problems including vascular dementia, agitation, weakness, afib . Palliative Care was asked to follow this patient by consultation request of Slade-Hartman, Ivette Loyal* to help address advance care planning and goals of care. This is a follow up visit.  CODE STATUS: DNR  PPS: 30% HOSPICE ELIGIBILITY/DIAGNOSIS: possibly, with commensurate goals of care.   PAST MEDICAL HISTORY:  Past Medical History:  Diagnosis Date  . Anxiety disorder   . Cataracts, bilateral   . Dementia ([place])   . Hypertension   . Major depressive disorder     SOCIAL HX:  Social History   Tobacco Use  . Smoking status: Never Smoker  . Smokeless tobacco: Never Used  Substance Use Topics  . Alcohol use: No    ALLERGIES:  Allergies  Allergen Reactions  . Fish Allergy   . Shrimp [Shellfish Allergy]      PERTINENT MEDICATIONS:  Outpatient Encounter Medications as of 08/29/2019  Medication Sig  . acetaminophen (TYLENOL) 325 MG tablet Take 2 tablets (650 mg total) by mouth every 6 (six) hours as needed for mild pain (or Fever >/= 101).  Marland Kitchen albuterol (PROVENTIL HFA;VENTOLIN HFA) 108 (90 Base) MCG/ACT inhaler Inhale 2 puffs into the lungs every 4 (four) hours as needed for wheezing or shortness of breath (or cough).  Marland Kitchen apixaban (ELIQUIS) 5 MG TABS tablet Take 1 tablet (5 mg total) by mouth 2 (two) times daily.  Marland Kitchen atenolol (TENORMIN) 50 MG tablet Take 25 mg by mouth daily.   . cholecalciferol (VITAMIN D) 1000 units  tablet Take 2,000 Units by mouth daily.  Marland Kitchen diltiazem (CARDIZEM CD) 120 MG 24 hr capsule Take 1 capsule (120 mg total) by mouth daily. Hold the medicine if heart rate is less than 60 or systolic blood pressure less than 100  . donepezil (ARICEPT) 10 MG tablet Take 1 tablet by mouth at bedtime.   . fluconazole (DIFLUCAN) 100 MG tablet Take 100 mg by mouth daily.  Marland Kitchen ketoconazole  (NIZORAL) 2 % cream Apply 1 application topically daily as needed for irritation. To underneath breasts  . lisinopril (PRINIVIL,ZESTRIL) 10 MG tablet Take 1 tablet (10 mg total) by mouth daily. (Patient taking differently: Take 5 mg by mouth daily. )  . liver oil-zinc oxide (DESITIN) 40 % ointment Apply to peri-area and buttocks three times daily and as needed for incontinence with skin irritation  . polyethylene glycol (MIRALAX / GLYCOLAX) 17 g packet Take 17 g by mouth daily.  . QUEtiapine (SEROQUEL) 25 MG tablet Take 25 mg by mouth 2 (two) times daily.  . sertraline (ZOLOFT) 25 MG tablet Take 100 mg by mouth every other day.   Marland Kitchen Spacer/Aero-Holding Chambers DEVI 1 puff by Does not apply route every 4 (four) hours as needed.  . vitamin B-12 (CYANOCOBALAMIN) 250 MCG tablet Take 250 mcg by mouth daily.   . [DISCONTINUED] NON FORMULARY Diet Type: NAS  . [DISCONTINUED] nystatin cream (MYCOSTATIN) Apply to peri-area three times daily and as needed with incontinence episodes until resolved   No facility-administered encounter medications on file as of 08/29/2019.    PHYSICAL EXAM / ROS:   Current and past weights: 1/21 147.0, 132.6 5/21; 10% weight loss in 5 months.Will continue to monitor General: NAD, frail appearing, thin Cardiovascular: no chest pain reported, no edema  Pulmonary: no cough, no increased SOB, room air Abdomen: appetite fair to poor, endorses constipation, incontinent of bowel GU: denies dysuria, incontinent of urine. Recent C and S was indeterminate.  MSK:  +  joint and ROM abnormalities, non ambulatory, mostly in bed. Skin: UE skin tears Neurological: Weakness, increased agitation due to roommate disruption, pleasant today and discusses learning to sew and her doll collection.  Jason Coop, NP [facility]  COVID-19 PATIENT SCREENING TOOL  Person answering questions: ____________Staff_______ _____   1.  Is the patient or any family member in the home showing any  signs or symptoms regarding respiratory infection?               Person with Symptom- __________NA_________________  a. Fever                                                                          Yes___ No___          ___________________  b. Shortness of breath                                                    Yes___ No___          ___________________ c. Cough/congestion  Yes___  No___         ___________________ d. Body aches/pains                                                         Yes___ No___        ____________________ e. Gastrointestinal symptoms (diarrhea, nausea)           Yes___ No___        ____________________  2. Within the past 14 days, has anyone living in the home had any contact with someone with or under investigation for COVID-19?    Yes___ No_X_   Person __________________   

## 2019-10-28 IMAGING — CR DG CHEST 2V
2 series · 2 of 2 positions shown · non-contrast
Comparison: 09/12/2017 chest radiograph

CLINICAL DATA: 83 y/o F; productive cough, shortness of breath, and
fatigue over the last 2 days.

EXAM:
CHEST - 2 VIEW

[chest lat]
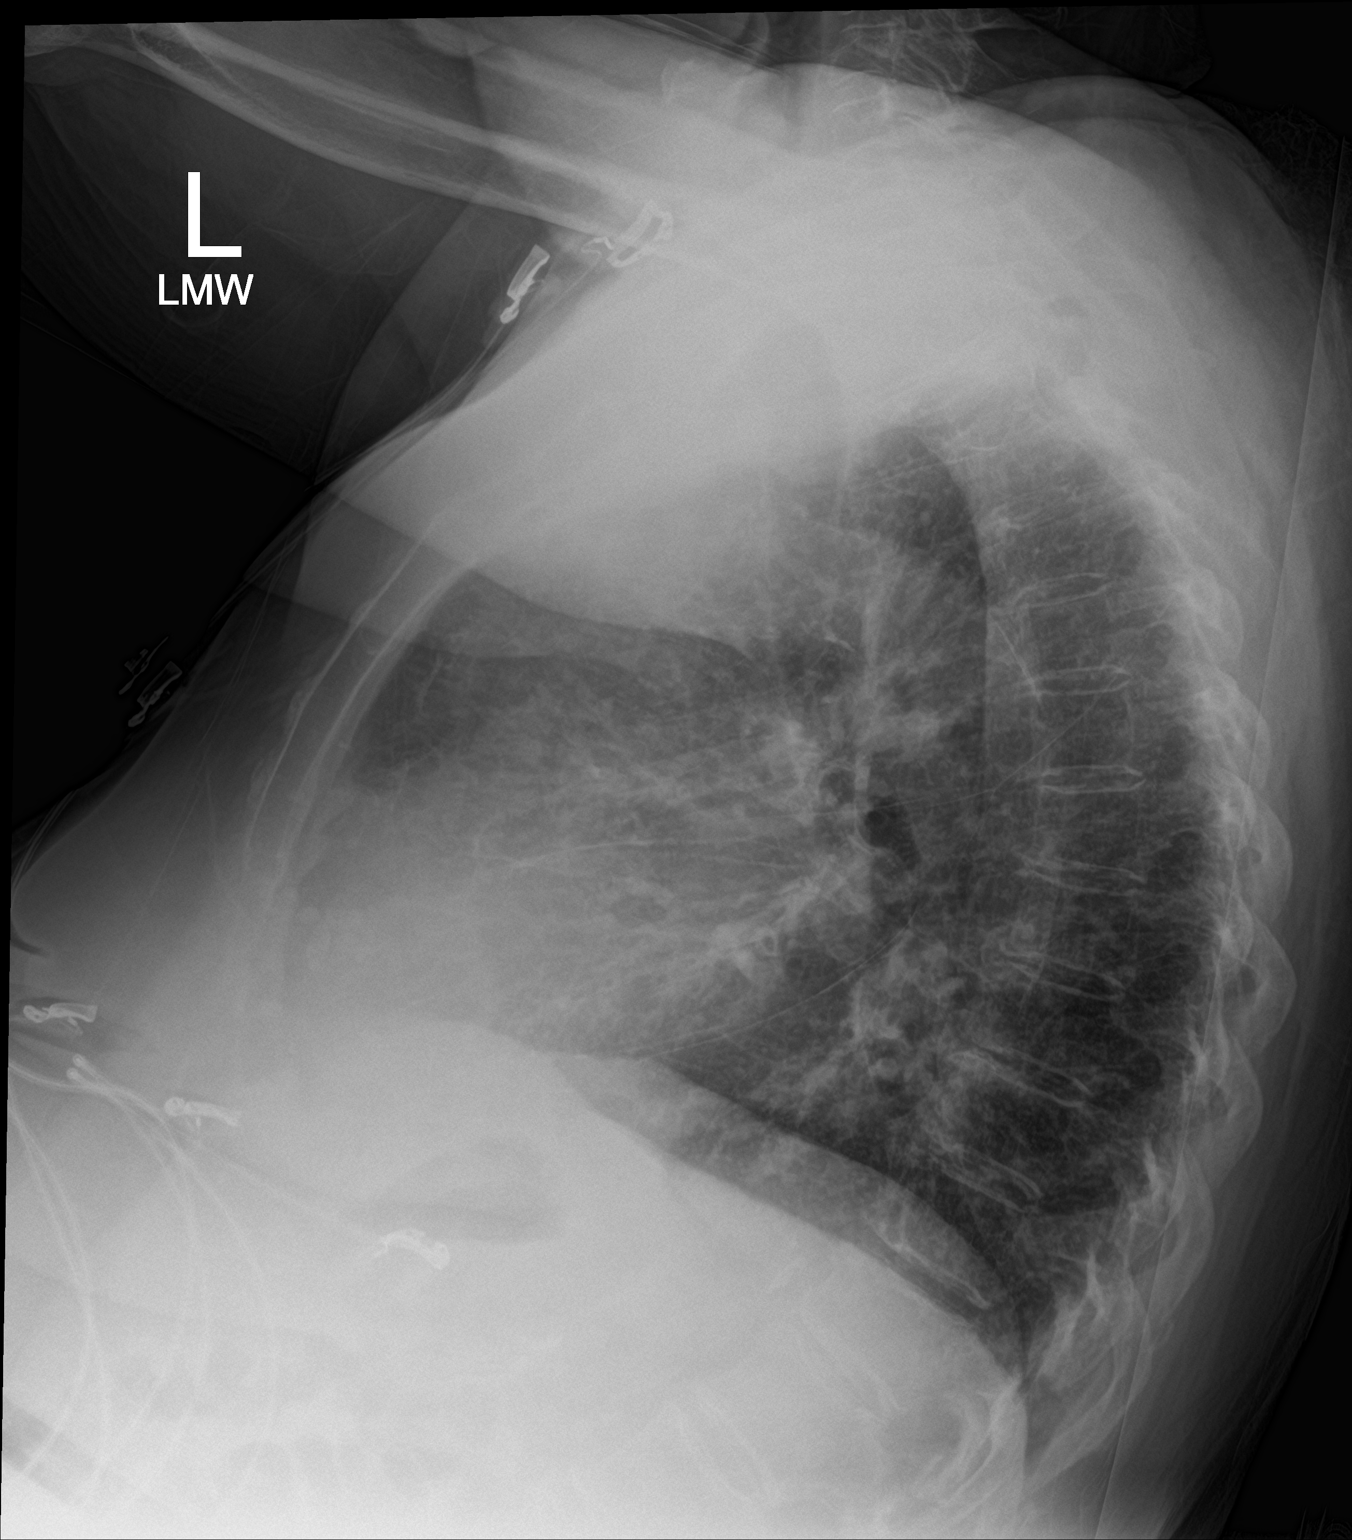

[chest ap]
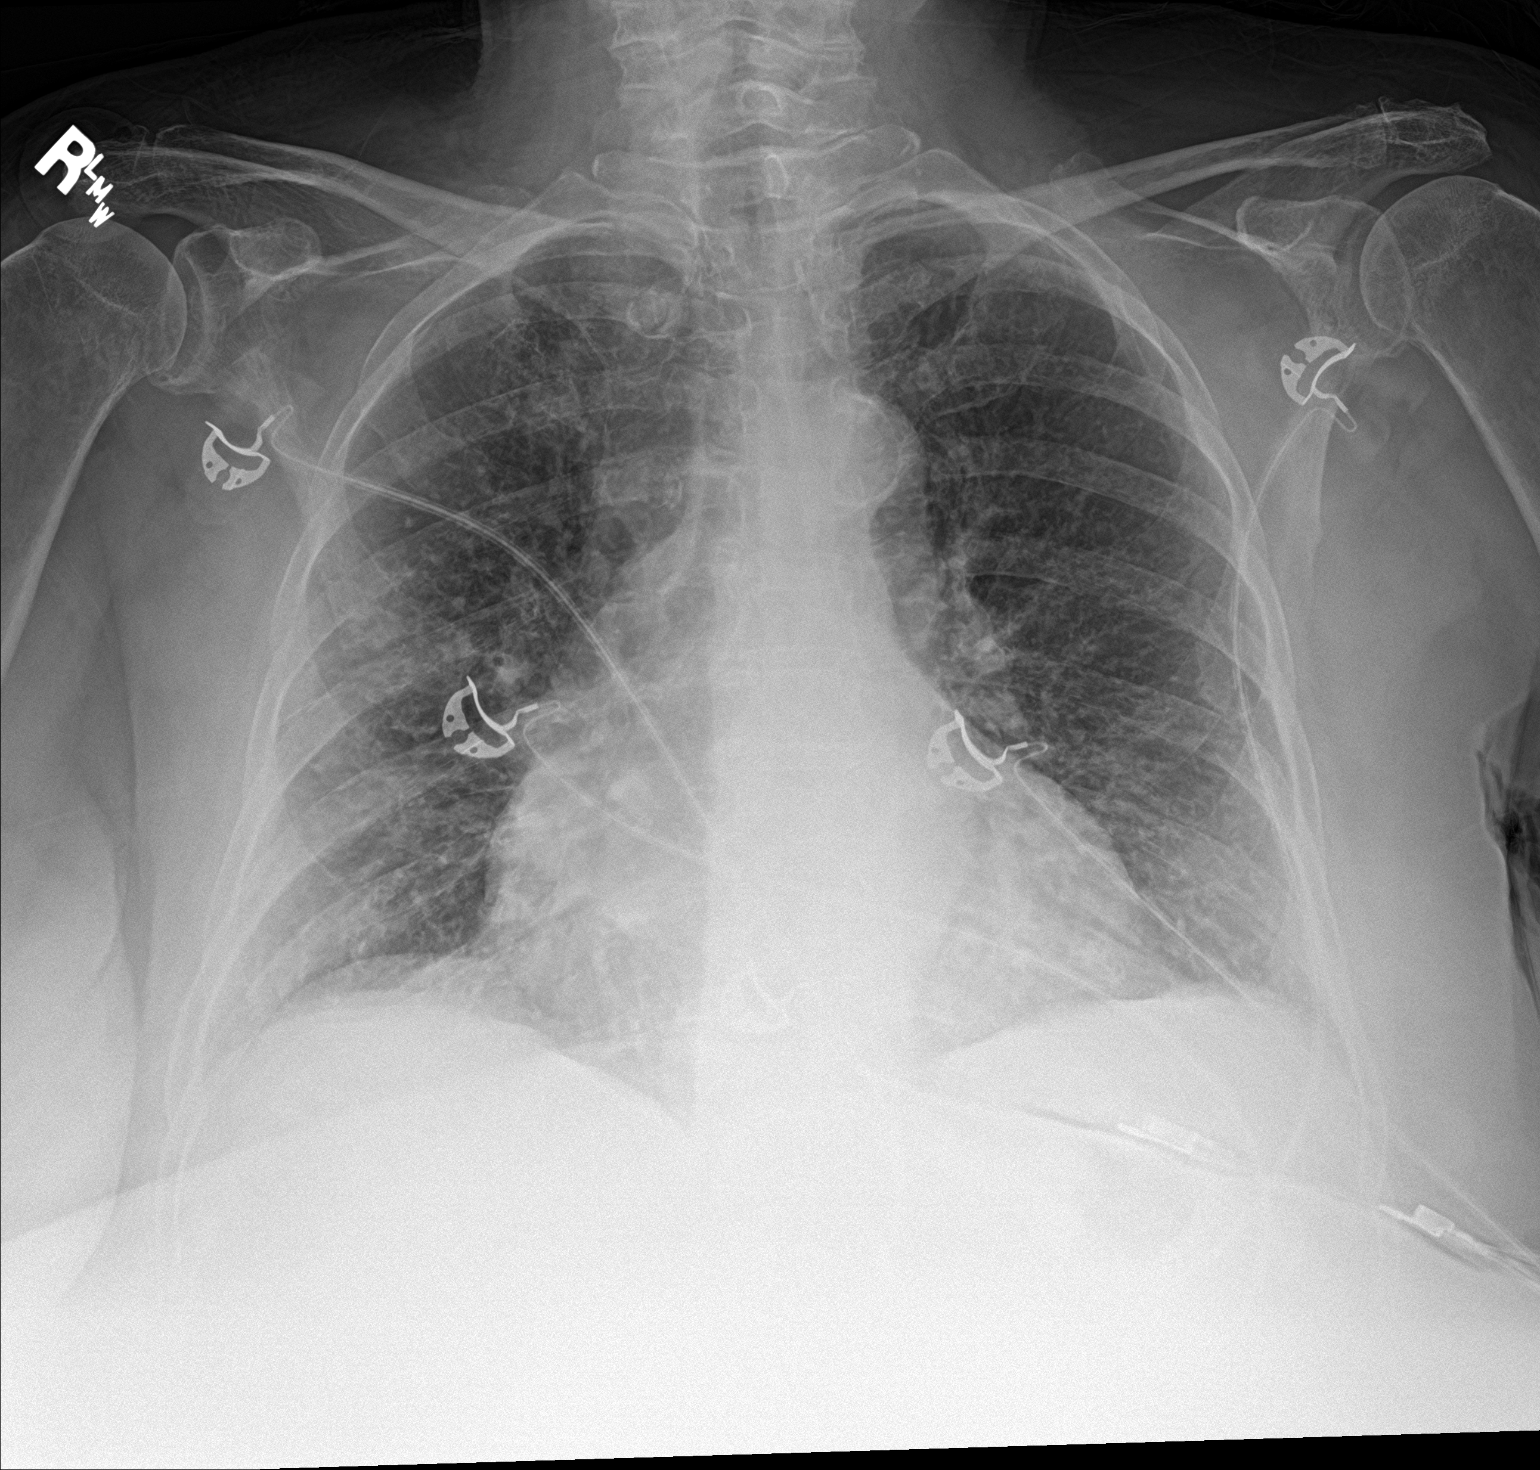

[2 of 2 positions shown; findings below may reference images not displayed]

FINDINGS: Stable cardiomegaly given projection and technique. Aortic
atherosclerosis with calcification. Increase bronchitic changes and
patchy opacities of the lungs. No pleural effusion or pneumothorax.
No acute osseous abnormality is evident.
IMPRESSION: 1. Increased bronchitic changes and patchy opacities of the lungs
probably representing acute bronchitis/early bronchopneumonia.
2. Aortic atherosclerosis.
3. Cardiomegaly.

By: Nadogradnja Mei M.D.

## 2019-12-01 IMAGING — DX DG CHEST 1V PORT
1 series · 1 of 1 positions shown · non-contrast
Comparison: 11/18/2017

CLINICAL DATA: Sepsis

EXAM:
PORTABLE CHEST 1 VIEW

[chest ap]
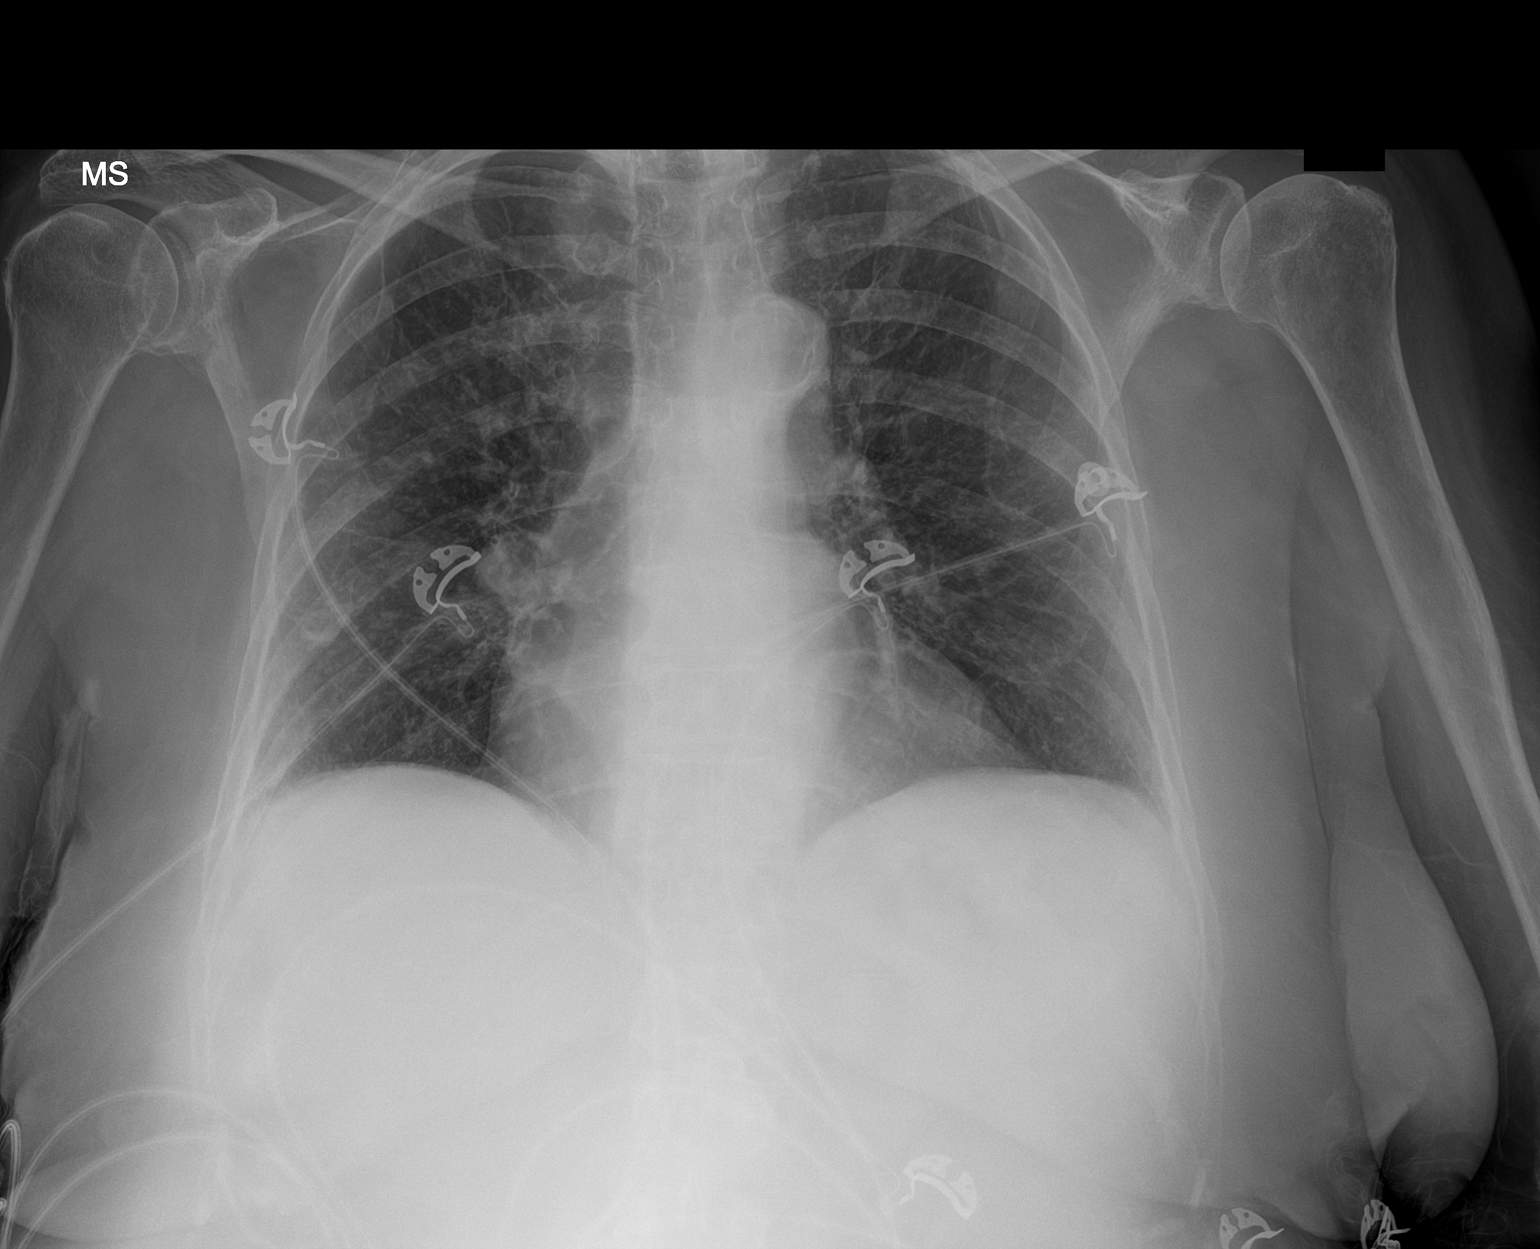

[1 of 1 positions shown; findings below may reference images not displayed]

FINDINGS: There is mild bilateral chronic interstitial thickening. There is no
focal consolidation. There is no pleural effusion or pneumothorax.
The heart and mediastinal contours are unremarkable.

The osseous structures are unremarkable.
IMPRESSION: No active disease.

## 2020-02-04 DEATH — deceased
